# Patient Record
Sex: Male | Born: 1995 | Race: White | Hispanic: No | Marital: Single | Smoking: Never smoker
Health system: Southern US, Community
[De-identification: ages and names within clinical notes are randomized; demographics above are authoritative.]

## PROBLEM LIST (undated history)

## (undated) DIAGNOSIS — F419 Anxiety disorder, unspecified: Secondary | ICD-10-CM

## (undated) DIAGNOSIS — J45909 Unspecified asthma, uncomplicated: Secondary | ICD-10-CM

## (undated) DIAGNOSIS — B019 Varicella without complication: Secondary | ICD-10-CM

## (undated) HISTORY — DX: Varicella without complication: B01.9

## (undated) HISTORY — DX: Unspecified asthma, uncomplicated: J45.909

## (undated) HISTORY — PX: WISDOM TOOTH EXTRACTION: SHX21

## (undated) HISTORY — PX: OTHER SURGICAL HISTORY: SHX169

---
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
## 2015-02-22 IMAGING — CT CT HEAD WITHOUT CONTRAST
2 series · 14 of 30 positions shown, 16 images · non-contrast
Comparison: None.

CLINICAL DATA: Head injury with dizziness, headache and nausea.

EXAM:
CT HEAD WITHOUT CONTRAST
TECHNIQUE: Contiguous axial images were obtained from the base of the skull
through the vertex without intravenous contrast.

[Series 2: head wo · axial · 0.39mm/px · z∈[-116,-17]mm · 6 of 32 slices shown, 8 images]
[im 5/32  brain]
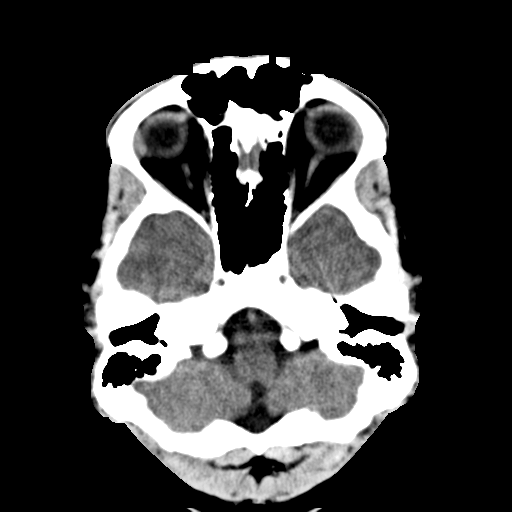
[im 5/32  bone]
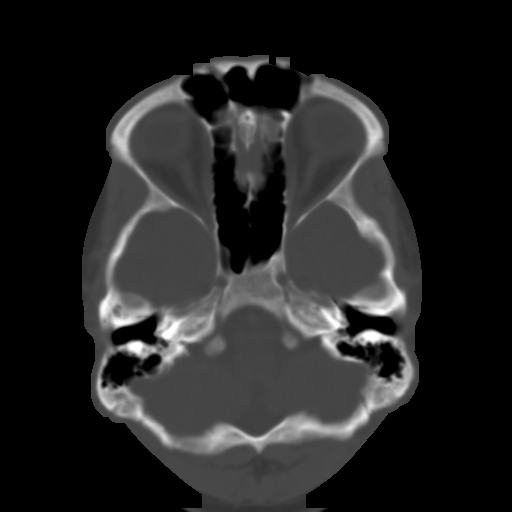
[im 9/32  brain]
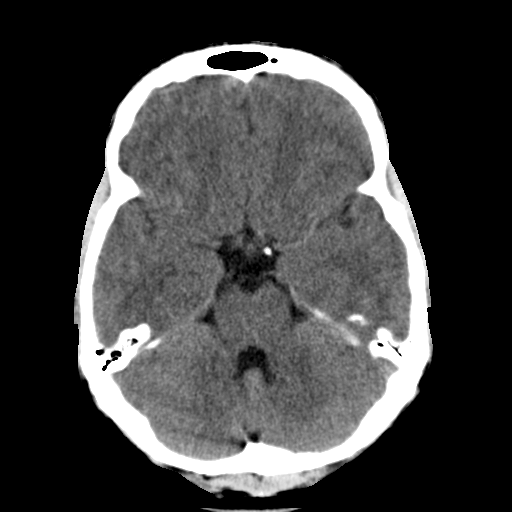
[im 14/32  brain]
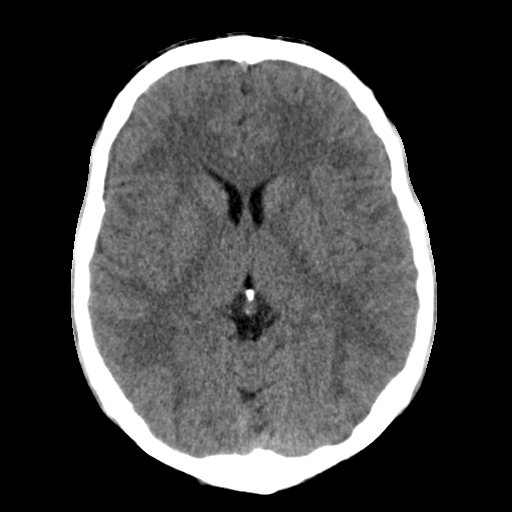
[im 18/32  brain]
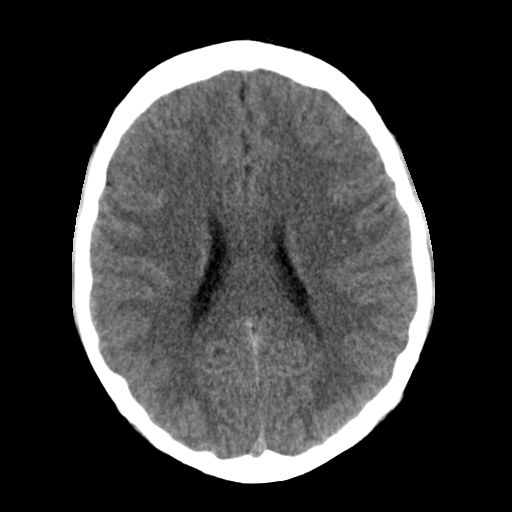
[im 23/32  brain]
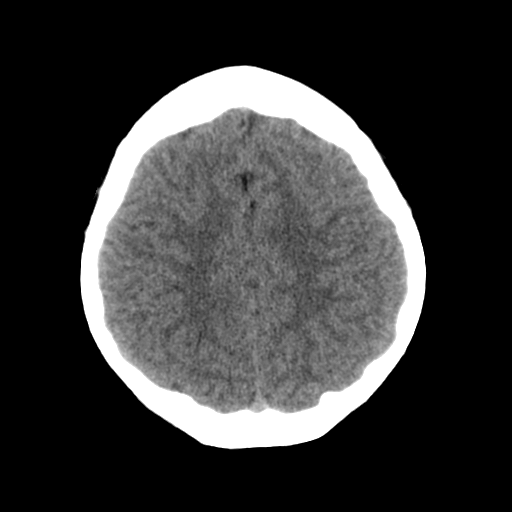
[im 23/32  bone]
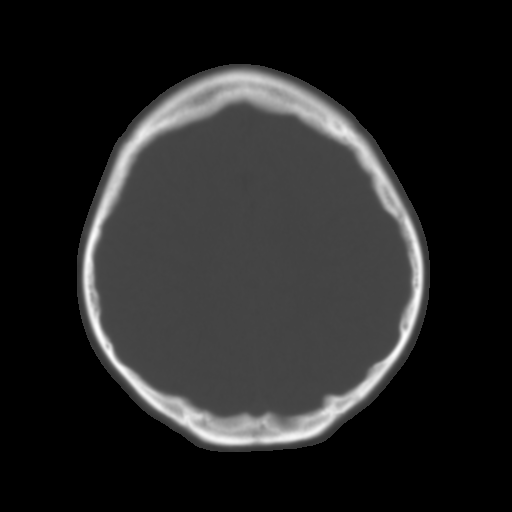
[im 27/32  brain]
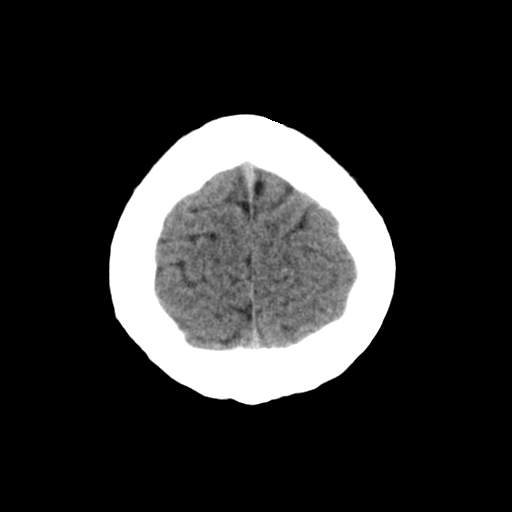

[Series 3: head bone · axial · 0.39mm/px · z∈[-122,-8]mm · 8 of 96 slices shown]
[im 10/96  bone]
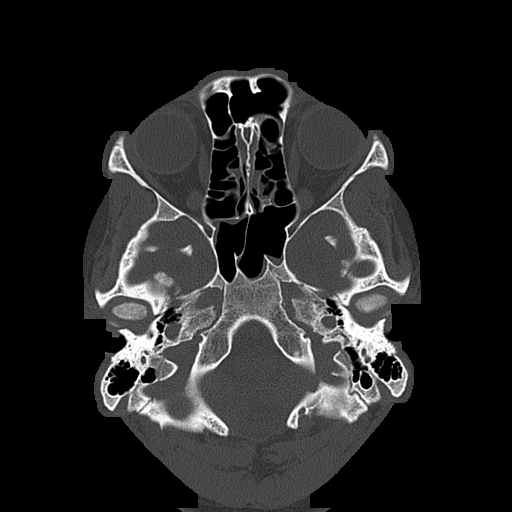
[im 19/96  bone]
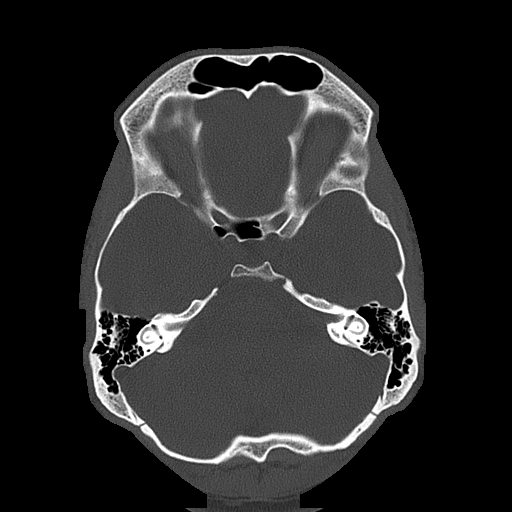
[im 32/96  bone]
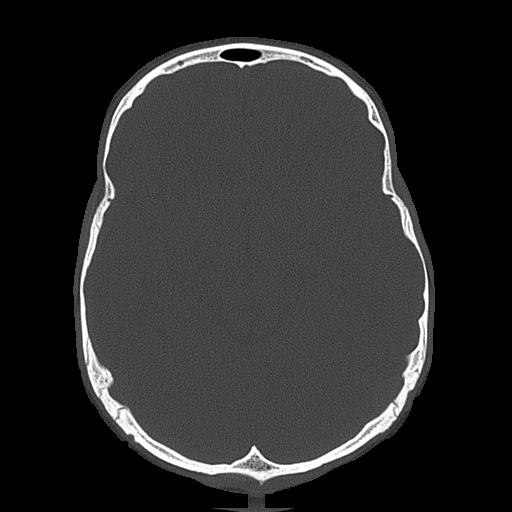
[im 41/96  bone]
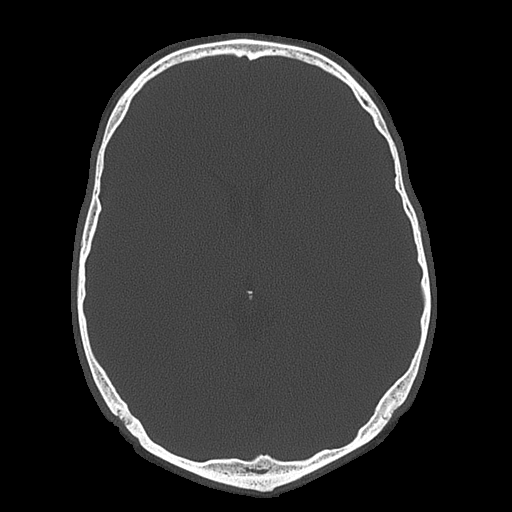
[im 55/96  bone]
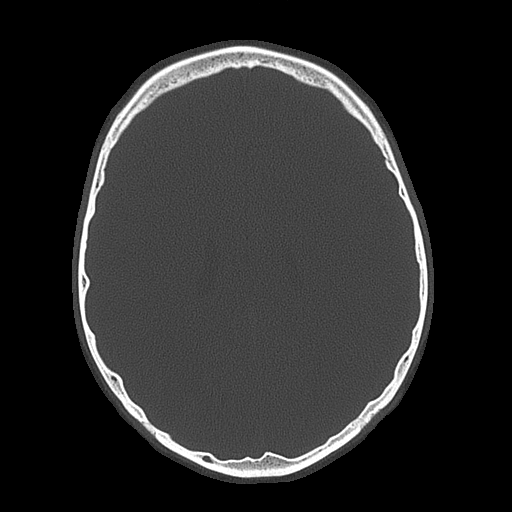
[im 64/96  bone]
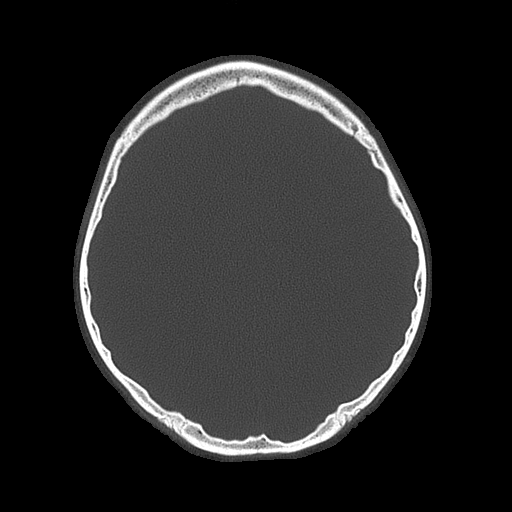
[im 77/96  bone]
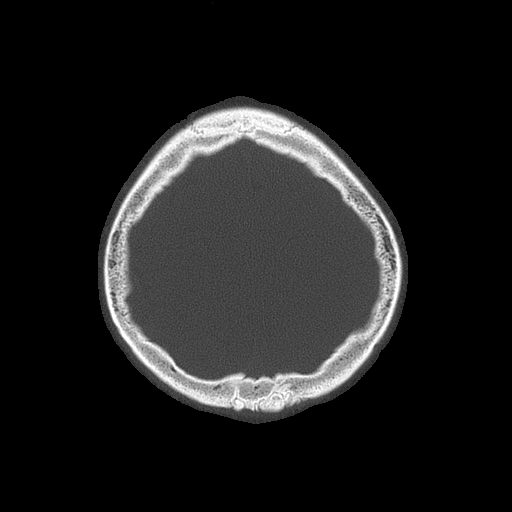
[im 86/96  bone]
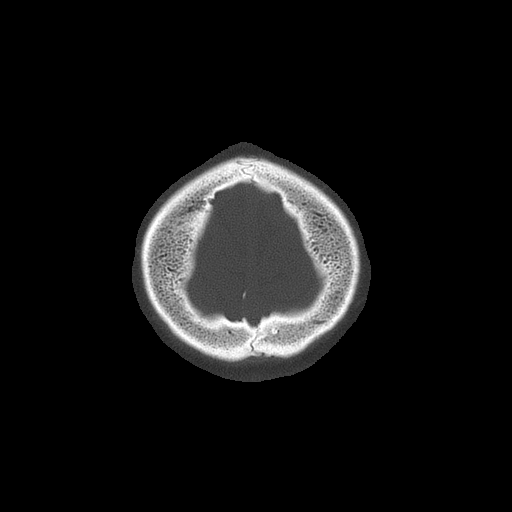

[14 of 30 positions shown; findings below may reference images not displayed]

FINDINGS: The ventricles and sulci are within normal limits for age. There is
no evidence of acute infarct, intracranial hemorrhage, mass, midline
shift, or extra-axial collection. The orbits are unremarkable. The
mastoid air cells are clear. Mild mucosal thickening is present in
the ethmoid, sphenoid, and maxillary sinuses bilaterally,
incompletely visualized. There is no evidence of acute fracture.
IMPRESSION: No evidence of acute intracranial abnormality. Unremarkable
appearance of the brain.

## 2015-06-04 ENCOUNTER — Ambulatory Visit: Payer: Self-pay | Admitting: Family Medicine

## 2015-08-06 ENCOUNTER — Encounter: Payer: Self-pay | Admitting: Family Medicine

## 2015-08-06 ENCOUNTER — Ambulatory Visit (INDEPENDENT_AMBULATORY_CARE_PROVIDER_SITE_OTHER): Payer: Commercial Indemnity | Admitting: Family Medicine

## 2015-08-06 VITALS — BP 110/82 | HR 73 | Temp 98.5°F | Ht 68.5 in | Wt 159.4 lb

## 2015-08-06 DIAGNOSIS — Z8349 Family history of other endocrine, nutritional and metabolic diseases: Secondary | ICD-10-CM

## 2015-08-06 DIAGNOSIS — Z23 Encounter for immunization: Secondary | ICD-10-CM

## 2015-08-06 DIAGNOSIS — Z0000 Encounter for general adult medical examination without abnormal findings: Secondary | ICD-10-CM | POA: Insufficient documentation

## 2015-08-06 DIAGNOSIS — Z130 Encounter for screening for diseases of the blood and blood-forming organs and certain disorders involving the immune mechanism: Secondary | ICD-10-CM

## 2015-08-06 DIAGNOSIS — Z83438 Family history of other disorder of lipoprotein metabolism and other lipidemia: Secondary | ICD-10-CM

## 2015-08-06 DIAGNOSIS — J45909 Unspecified asthma, uncomplicated: Secondary | ICD-10-CM | POA: Insufficient documentation

## 2015-08-06 DIAGNOSIS — Z833 Family history of diabetes mellitus: Secondary | ICD-10-CM

## 2015-08-06 DIAGNOSIS — J4520 Mild intermittent asthma, uncomplicated: Secondary | ICD-10-CM

## 2015-08-06 LAB — CBC
HEMATOCRIT: 47.9 % (ref 36.0–49.0)
Hemoglobin: 16.2 g/dL — ABNORMAL HIGH (ref 12.0–16.0)
MCHC: 33.8 g/dL (ref 31.0–37.0)
MCV: 89 fl (ref 78.0–98.0)
Platelets: 235 10*3/uL (ref 150.0–575.0)
RBC: 5.38 Mil/uL (ref 3.80–5.70)
RDW: 12.3 % (ref 11.4–15.5)
WBC: 9.5 10*3/uL (ref 4.5–13.5)

## 2015-08-06 LAB — LIPID PANEL
CHOLESTEROL: 158 mg/dL (ref 0–200)
HDL: 40.3 mg/dL (ref >39.00)
LDL Cholesterol: 96 mg/dL (ref 0–99)
NonHDL: 117.85
TRIGLYCERIDES: 107 mg/dL (ref 0.0–149.0)
Total CHOL/HDL Ratio: 4
VLDL: 21.4 mg/dL (ref 0.0–40.0)

## 2015-08-06 LAB — COMPREHENSIVE METABOLIC PANEL
ALBUMIN: 4.4 g/dL (ref 3.5–5.2)
ALT: 56 U/L — ABNORMAL HIGH (ref 0–53)
AST: 24 U/L (ref 0–37)
Alkaline Phosphatase: 84 U/L (ref 52–171)
BUN: 12 mg/dL (ref 6–23)
CALCIUM: 9.9 mg/dL (ref 8.4–10.5)
CHLORIDE: 101 meq/L (ref 96–112)
CO2: 28 mEq/L (ref 19–32)
CREATININE: 1.05 mg/dL (ref 0.40–1.50)
GFR: 95.98 mL/min (ref >60.00)
Glucose, Bld: 102 mg/dL — ABNORMAL HIGH (ref 70–99)
Potassium: 4 mEq/L (ref 3.5–5.1)
SODIUM: 138 meq/L (ref 135–145)
Total Bilirubin: 0.4 mg/dL (ref 0.2–1.2)
Total Protein: 7.4 g/dL (ref 6.0–8.3)

## 2015-08-06 LAB — HEMOGLOBIN A1C: Hgb A1c MFr Bld: 5.3 % (ref 4.6–6.5)

## 2015-08-06 MED ORDER — ALBUTEROL SULFATE HFA 108 (90 BASE) MCG/ACT IN AERS: 2.0000 | INHALATION_SPRAY | Freq: Four times a day (QID) | RESPIRATORY_TRACT | Status: DC | PRN

## 2015-08-06 NOTE — Patient Instructions (Signed)
It was nice to see you today.  We will call you with lab results on Monday.  Please see me annually and sooner if needed.  Call with any concerns or questions.  Take care  Dr. Adriana Simasook

## 2015-08-06 NOTE — Progress Notes (Signed)
Pre visit review using our clinic review tool, if applicable. No additional management support is needed unless otherwise documented below in the visit note. 

## 2015-08-06 NOTE — Progress Notes (Signed)
Subjective:  Patient ID: Ryan Norton, male    DOB: 1995/12/12  Age: 19 y.o. MRN: [id number]  CC: Establish care  HPI Ryan Norton is a 19 y.o. male presents to the clinic today to establish care.    Preventative Healthcare  Immunizations: In need a flu shot. Tetanus up-to-date as it was given in 2008.  Labs: Mother requests screening labs today.  Alcohol use: No.  Smoking/tobacco use: No.3  STD/HIV testing: N/A. Patient is not sexually active.  Regular dental exams: Yes.   Wears seat belt: Yes.   Asthma  Patient has a prior history of asthma.  Per patient and mother seems to be seasonal/exercise induced.  The last time he required albuterol was June of this year.  He needs an albuterol inhaler at home.  No recent shortness of breath, cough, or need for albuterol.  PMH, Surgical Hx, Family Hx, Social History reviewed and updated as below. Past Medical History  Diagnosis Date  . Asthma   . Chicken pox     Past Surgical History  Procedure Laterality Date  . No surgical history      Family History  Problem Relation Age of Onset  . Hyperlipidemia Father   . Hypertension Father   . Diabetes Paternal Grandmother     Social History  Substance Use Topics  . Smoking status: Never Smoker   . Smokeless tobacco: Never Used  . Alcohol Use: No   Review of Systems  Psychiatric/Behavioral:       Stress.  All other systems negative.  Objective:   Today's Vitals: BP 110/82 mmHg  Pulse 73  Temp(Src) 98.5 F (36.9 C) (Oral)  Ht 5' 8.5" (1.74 m)  Wt 159 lb 6 oz (72.292 kg)  BMI 23.88 kg/m2  SpO2 97%  Physical Exam  Constitutional: He is oriented to person, place, and time. He appears well-developed and well-nourished. No distress.  HENT:  Head: Normocephalic and atraumatic.  Nose: Nose normal.  Mouth/Throat: Oropharynx is clear and moist. No oropharyngeal exudate.  Normal TM's bilaterally.   Eyes: Conjunctivae are normal. No scleral icterus.    Neck: Neck supple. No thyromegaly present.  Cardiovascular: Normal rate and regular rhythm.   No murmur heard. Pulmonary/Chest: Effort normal and breath sounds normal. He has no wheezes. He has no rales.  Abdominal: Soft. He exhibits no distension. There is no tenderness. There is no rebound and no guarding.  Musculoskeletal: Normal range of motion. He exhibits no edema.  Lymphadenopathy:    He has no cervical adenopathy.  Neurological: He is alert and oriented to person, place, and time.  Skin: Skin is warm and dry. No rash noted.  Psychiatric: He has a normal mood and affect.  Vitals reviewed.  Assessment & Plan:   Problem List Items Addressed This Visit    Preventative health care - Primary    Flu shot given today. Today up-to-date. Lab work today at the insistence of the patient's mother:  CBC, CMP, lipid, A1c.      Asthma    Well controlled.  Albuterol Rx given today.      Relevant Medications   albuterol (PROVENTIL HFA;VENTOLIN HFA) 108 (90 BASE) MCG/ACT inhaler    Other Visit Diagnoses    Screening for deficiency anemia        Relevant Orders    CBC    Family history of hyperlipidemia        Relevant Orders    Lipid panel    Family history of  diabetes mellitus        Relevant Orders    Comp Met (CMET)    HgB A1c    Encounter for immunization           Outpatient Encounter Prescriptions as of 08/06/2015  Medication Sig  . albuterol (PROVENTIL HFA;VENTOLIN HFA) 108 (90 BASE) MCG/ACT inhaler Inhale 2 puffs into the lungs every 6 (six) hours as needed for wheezing or shortness of breath.   No facility-administered encounter medications on file as of 08/06/2015.    Follow-up: Annually or sooner if needed.    Coral Spikes DO

## 2015-08-06 NOTE — Assessment & Plan Note (Signed)
Well controlled.  Albuterol Rx given today.

## 2015-08-06 NOTE — Assessment & Plan Note (Signed)
Flu shot given today. Today up-to-date. Lab work today at the insistence of the patient's mother:  CBC, CMP, lipid, A1c.

## 2015-08-30 ENCOUNTER — Other Ambulatory Visit: Payer: Self-pay | Admitting: Family Medicine

## 2015-08-30 ENCOUNTER — Telehealth: Payer: Self-pay | Admitting: *Deleted

## 2015-08-30 ENCOUNTER — Other Ambulatory Visit (INDEPENDENT_AMBULATORY_CARE_PROVIDER_SITE_OTHER): Payer: Commercial Indemnity

## 2015-08-30 DIAGNOSIS — R945 Abnormal results of liver function studies: Secondary | ICD-10-CM

## 2015-08-30 DIAGNOSIS — R7989 Other specified abnormal findings of blood chemistry: Secondary | ICD-10-CM

## 2015-08-30 DIAGNOSIS — D582 Other hemoglobinopathies: Secondary | ICD-10-CM

## 2015-08-30 LAB — CBC
HEMATOCRIT: 50.8 % — AB (ref 36.0–49.0)
HEMOGLOBIN: 16.9 g/dL — AB (ref 12.0–16.0)
MCHC: 33.2 g/dL (ref 31.0–37.0)
MCV: 89.8 fl (ref 78.0–98.0)
Platelets: 262 10*3/uL (ref 150.0–575.0)
RBC: 5.66 Mil/uL (ref 3.80–5.70)
RDW: 12.8 % (ref 11.4–15.5)
WBC: 8.4 10*3/uL (ref 4.5–13.5)

## 2015-08-30 LAB — COMPREHENSIVE METABOLIC PANEL
ALK PHOS: 84 U/L (ref 52–171)
ALT: 48 U/L (ref 0–53)
AST: 24 U/L (ref 0–37)
Albumin: 4.6 g/dL (ref 3.5–5.2)
BUN: 12 mg/dL (ref 6–23)
CHLORIDE: 101 meq/L (ref 96–112)
CO2: 30 meq/L (ref 19–32)
Calcium: 10 mg/dL (ref 8.4–10.5)
Creatinine, Ser: 0.96 mg/dL (ref 0.40–1.50)
GFR: 106.36 mL/min (ref >60.00)
GLUCOSE: 93 mg/dL (ref 70–99)
POTASSIUM: 4 meq/L (ref 3.5–5.1)
Sodium: 139 mEq/L (ref 135–145)
Total Bilirubin: 0.6 mg/dL (ref 0.2–1.2)
Total Protein: 7.2 g/dL (ref 6.0–8.3)

## 2015-08-30 NOTE — Telephone Encounter (Signed)
Labs and dx?  

## 2016-07-11 ENCOUNTER — Ambulatory Visit (INDEPENDENT_AMBULATORY_CARE_PROVIDER_SITE_OTHER): Payer: Commercial Indemnity | Admitting: Family Medicine

## 2016-07-11 ENCOUNTER — Encounter: Payer: Self-pay | Admitting: Family Medicine

## 2016-07-11 VITALS — BP 108/72 | HR 66 | Temp 97.7°F | Ht 68.5 in | Wt 164.1 lb

## 2016-07-11 DIAGNOSIS — Z0000 Encounter for general adult medical examination without abnormal findings: Secondary | ICD-10-CM | POA: Diagnosis not present

## 2016-07-11 DIAGNOSIS — Z0001 Encounter for general adult medical examination with abnormal findings: Secondary | ICD-10-CM | POA: Insufficient documentation

## 2016-07-11 DIAGNOSIS — Z23 Encounter for immunization: Secondary | ICD-10-CM

## 2016-07-11 LAB — COMPREHENSIVE METABOLIC PANEL
ALT: 58 U/L — ABNORMAL HIGH (ref 0–53)
AST: 26 U/L (ref 0–37)
Albumin: 4.2 g/dL (ref 3.5–5.2)
Alkaline Phosphatase: 74 U/L (ref 39–117)
BUN: 10 mg/dL (ref 6–23)
CO2: 31 meq/L (ref 19–32)
CREATININE: 1 mg/dL (ref 0.40–1.50)
Calcium: 9 mg/dL (ref 8.4–10.5)
Chloride: 105 mEq/L (ref 96–112)
GFR: 100.59 mL/min (ref >60.00)
Glucose, Bld: 97 mg/dL (ref 70–99)
Potassium: 3.8 mEq/L (ref 3.5–5.1)
SODIUM: 140 meq/L (ref 135–145)
Total Bilirubin: 0.5 mg/dL (ref 0.2–1.2)
Total Protein: 7 g/dL (ref 6.0–8.3)

## 2016-07-11 LAB — HEMOGLOBIN A1C: Hgb A1c MFr Bld: 5.3 % (ref 4.6–6.5)

## 2016-07-11 LAB — LIPID PANEL
CHOLESTEROL: 137 mg/dL (ref 0–200)
HDL: 33.8 mg/dL — ABNORMAL LOW (ref >39.00)
LDL CALC: 82 mg/dL (ref 0–99)
NONHDL: 103.01
Total CHOL/HDL Ratio: 4
Triglycerides: 104 mg/dL (ref 0.0–149.0)
VLDL: 20.8 mg/dL (ref 0.0–40.0)

## 2016-07-11 LAB — CBC
HCT: 47.4 % (ref 39.0–52.0)
Hemoglobin: 15.9 g/dL (ref 13.0–17.0)
MCHC: 33.6 g/dL (ref 30.0–36.0)
MCV: 89.4 fl (ref 78.0–100.0)
Platelets: 240 10*3/uL (ref 150.0–400.0)
RBC: 5.3 Mil/uL (ref 4.22–5.81)
RDW: 12.8 % (ref 11.5–14.6)
WBC: 8.5 10*3/uL (ref 4.5–10.5)

## 2016-07-11 NOTE — Patient Instructions (Signed)

## 2016-07-11 NOTE — Assessment & Plan Note (Signed)
Flu shot today. Screening labs today. Remainder of preventative healthcare up to date.

## 2016-07-11 NOTE — Progress Notes (Signed)
Subjective:  Patient ID: Ryan Norton, male    DOB: Sep 24, 1996  Age: 20 y.o. MRN: [id number]  CC: Annual physical exam  HPI Ryan Calixyler J Blasdell is a 20 y.o. male presents to the clinic today for an annual physical exam.  Preventative Healthcare  Immunizations: In need a flu shot today. Tdap up to date.   Labs: Requesting screening labs.  Alcohol use: No.  Smoking/tobacco use: No  STD/HIV testing: N/A. Patient is not sexually active.  Regular dental exams: Yes.   Wears seat belt: Yes.   PMH, Surgical Hx, Family Hx, Social History reviewed and updated as below.  Past Medical History:  Diagnosis Date  . Asthma   . Chicken pox    Past Surgical History:  Procedure Laterality Date  . no surgical history     Family History  Problem Relation Age of Onset  . Hyperlipidemia Father   . Hypertension Father   . Diabetes Paternal Grandmother    Social History  Substance Use Topics  . Smoking status: Never Smoker  . Smokeless tobacco: Never Used  . Alcohol use No   Review of Systems General: Denies unexplained weight loss, fever. Skin: Denies new or changing mole, sore/wound that won't heal. ENT: Trouble hearing, ringing in the ears, sores in the mouth, hoarseness, trouble swallowing. Eyes: Denies trouble seeing/visual disturbance. Heart/CV: Denies chest pain, shortness of breath, edema, palpitations. Lungs/Resp: Denies cough, shortness of breath, hemoptysis. Abd/GI: Denies nausea, vomiting, diarrhea, constipation, abdominal pain, hematochezia, melena. GU: Denies dysuria, incontinence, hematuria, urinary frequency, difficulty starting/keeping stream, penile discharge, sexual difficulty, lump in testicles. MSK: Denies joint pain/swelling, myalgias. Neuro: Denies headaches, weakness, numbness, dizziness, syncope. Psych: Denies sadness, anxiety, stress, memory difficulty. Endocrine: Denies polyuria and polydipsia.  Objective:   Today's Vitals: BP 108/72 (BP Location:  Left Arm, Patient Position: Sitting, Cuff Size: Normal)   Pulse 66   Temp 97.7 F (36.5 C)   Ht 5' 8.5" (1.74 m)   Wt 164 lb 2 oz (74.4 kg)   SpO2 98%   BMI 24.59 kg/m   Physical Exam  Constitutional: He is oriented to person, place, and time. He appears well-developed and well-nourished. No distress.  HENT:  Head: Normocephalic and atraumatic.  Nose: Nose normal.  Mouth/Throat: Oropharynx is clear and moist. No oropharyngeal exudate.  Normal TM's bilaterally.   Eyes: Conjunctivae are normal. No scleral icterus.  Neck: Neck supple. No thyromegaly present.  Cardiovascular: Normal rate and regular rhythm.   No murmur heard. Pulmonary/Chest: Effort normal and breath sounds normal. He has no wheezes. He has no rales.  Abdominal: Soft. He exhibits no distension. There is no tenderness. There is no rebound and no guarding.  Musculoskeletal: Normal range of motion. He exhibits no edema.  Lymphadenopathy:    He has no cervical adenopathy.  Neurological: He is alert and oriented to person, place, and time.  Skin: Skin is warm and dry. No rash noted.  Psychiatric: He has a normal mood and affect.  Vitals reviewed.  Assessment & Plan:   Problem List Items Addressed This Visit    Annual physical exam - Primary    Flu shot today. Screening labs today. Remainder of preventative healthcare up to date.      Relevant Orders   CBC   Comprehensive metabolic panel   Hemoglobin A1c   Lipid panel    Other Visit Diagnoses   None.     Outpatient Encounter Prescriptions as of 07/11/2016  Medication Sig  . albuterol (PROVENTIL HFA;VENTOLIN  HFA) 108 (90 BASE) MCG/ACT inhaler Inhale 2 puffs into the lungs every 6 (six) hours as needed for wheezing or shortness of breath.   No facility-administered encounter medications on file as of 07/11/2016.     Follow-up: Annually  Everlene Other DO [facility]

## 2016-07-12 ENCOUNTER — Telehealth: Payer: Self-pay | Admitting: Family Medicine

## 2016-07-12 NOTE — Telephone Encounter (Signed)
Pt mom called returning your call. Thank you!  Call pt @ [phone].

## 2016-07-14 ENCOUNTER — Other Ambulatory Visit: Payer: Self-pay | Admitting: Family Medicine

## 2016-07-14 DIAGNOSIS — R7989 Other specified abnormal findings of blood chemistry: Secondary | ICD-10-CM

## 2016-07-14 DIAGNOSIS — R945 Abnormal results of liver function studies: Principal | ICD-10-CM

## 2016-07-14 NOTE — Telephone Encounter (Signed)
Pt mom called back to schedule his f/u lab for elevated ALT. Please enter lab

## 2016-07-14 NOTE — Telephone Encounter (Signed)
Pt mom was calling to get pt lab results from 07/11/16. Thank you!  Call pt mom @ [phone].

## 2016-07-14 NOTE — Telephone Encounter (Signed)
Spoke with Patients mom  Reviewed results, thanks

## 2016-07-14 NOTE — Telephone Encounter (Signed)
Order placed

## 2016-07-28 ENCOUNTER — Other Ambulatory Visit (INDEPENDENT_AMBULATORY_CARE_PROVIDER_SITE_OTHER): Payer: Commercial Indemnity

## 2016-07-28 DIAGNOSIS — R7989 Other specified abnormal findings of blood chemistry: Secondary | ICD-10-CM | POA: Diagnosis not present

## 2016-07-28 DIAGNOSIS — R945 Abnormal results of liver function studies: Principal | ICD-10-CM

## 2016-07-28 LAB — HEPATIC FUNCTION PANEL
ALBUMIN: 4.3 g/dL (ref 3.5–5.2)
ALK PHOS: 81 U/L (ref 39–117)
ALT: 50 U/L (ref 0–53)
AST: 23 U/L (ref 0–37)
BILIRUBIN DIRECT: 0.1 mg/dL (ref 0.0–0.3)
TOTAL PROTEIN: 7.3 g/dL (ref 6.0–8.3)
Total Bilirubin: 0.7 mg/dL (ref 0.2–1.2)

## 2016-09-27 ENCOUNTER — Encounter: Payer: Self-pay | Admitting: Family Medicine

## 2016-09-27 ENCOUNTER — Ambulatory Visit (INDEPENDENT_AMBULATORY_CARE_PROVIDER_SITE_OTHER): Payer: Managed Care, Other (non HMO) | Admitting: Family Medicine

## 2016-09-27 DIAGNOSIS — B9789 Other viral agents as the cause of diseases classified elsewhere: Secondary | ICD-10-CM | POA: Diagnosis not present

## 2016-09-27 DIAGNOSIS — K120 Recurrent oral aphthae: Secondary | ICD-10-CM | POA: Diagnosis not present

## 2016-09-27 DIAGNOSIS — J069 Acute upper respiratory infection, unspecified: Secondary | ICD-10-CM | POA: Diagnosis not present

## 2016-09-27 MED ORDER — PREDNISONE 50 MG PO TABS: ORAL_TABLET | ORAL | 0 refills | Status: DC

## 2016-09-27 MED ORDER — HYDROCOD POLST-CPM POLST ER 10-8 MG/5ML PO SUER: 5.0000 mL | Freq: Two times a day (BID) | ORAL | 0 refills | Status: DC | PRN

## 2016-09-27 MED ORDER — SULFURIC ACID-SULF PHENOLICS 30-50 % MT SOLN: OROMUCOSAL | 2 refills | Status: DC

## 2016-09-27 NOTE — Patient Instructions (Signed)
Take the prednisone and use the cough medication as needed.  Use the Debacterol per package insert.  This is viral and will slowly improve.  Take care  Dr. Adriana Simasook

## 2016-09-27 NOTE — Assessment & Plan Note (Signed)
New problem. Treating with Debacterol.

## 2016-09-27 NOTE — Assessment & Plan Note (Signed)
No acute problem. No indication for antibody screen Treating with prednisone and Tussionex given severe cough (and history of asthma).

## 2016-09-27 NOTE — Progress Notes (Signed)
Subjective:  Patient ID: Ryan Norton, male    DOB: [birth year]  Age: 20 y.o. MRN: [id number]  CC: Cough, Mouth ulcers, HA  HPI:  20 year old male presents with the above complaints.  Patient states that he's been sick for the past 2 days. He's had cough which she states is severe. He is also had drainage and headache. No fever. He also notes canker sores on the inside of the mouth. No known sick contacts. No known exacerbating or relieving factors. No other associated symptoms. No other complaints this time.  Social Hx   Social History   Social History  . Marital status: Single    Spouse name: N/A  . Number of children: N/A  . Years of education: N/A   Social History Main Topics  . Smoking status: Never Smoker  . Smokeless tobacco: Never Used  . Alcohol use No  . Drug use: No  . Sexual activity: No   Other Topics Concern  . None   Social History Narrative  . None   Review of Systems  Constitutional: Negative for fever.  HENT: Positive for mouth sores and postnasal drip.   Respiratory: Positive for cough.   Neurological: Positive for headaches.   Objective:  BP 116/67 (BP Location: Left Arm, Patient Position: Sitting, Cuff Size: Normal)   Pulse 80   Temp 98.4 F (36.9 C) (Oral)   Resp 12   Wt 166 lb (75.3 kg)   SpO2 96%   BMI 24.87 kg/m   BP/Weight 09/27/2016 07/11/2016 08/06/2015  Systolic BP 116 108 110  Diastolic BP 67 72 82  Wt. (Lbs) 166 164.13 159.38  BMI 24.87 24.59 23.88   Physical Exam  Constitutional: He is oriented to person, place, and time. He appears well-developed. No distress.  HENT:  Mild oropharyngeal erythema. 3 canker sores noted. Normal TMs bilaterally.  Eyes: Conjunctivae are normal.  Neck: Neck supple.  Cardiovascular: Normal rate and regular rhythm.   Pulmonary/Chest: Effort normal and breath sounds normal.  Lymphadenopathy:    He has no cervical adenopathy.  Neurological: He is alert and oriented to person, place, and  time.  Psychiatric: He has a normal mood and affect.  Vitals reviewed.   Lab Results  Component Value Date   WBC 8.5 07/11/2016   HGB 15.9 07/11/2016   HCT 47.4 07/11/2016   PLT 240.0 07/11/2016   GLUCOSE 97 07/11/2016   CHOL 137 07/11/2016   TRIG 104.0 07/11/2016   HDL 33.80 (L) 07/11/2016   LDLCALC 82 07/11/2016   ALT 50 07/28/2016   AST 23 07/28/2016   NA 140 07/11/2016   K 3.8 07/11/2016   CL 105 07/11/2016   CREATININE 1.00 07/11/2016   BUN 10 07/11/2016   CO2 31 07/11/2016   HGBA1C 5.3 07/11/2016    Assessment & Plan:   Problem List Items Addressed This Visit    URI (upper respiratory infection)    No acute problem. No indication for antibody screen Treating with prednisone and Tussionex given severe cough (and history of asthma).      Canker sore    New problem. Treating with Debacterol.          Meds ordered this encounter  Medications  . predniSONE (DELTASONE) 50 MG tablet    Sig: 1 tablet daily x 5 days.    Dispense:  5 tablet    Refill:  0  . chlorpheniramine-HYDROcodone (TUSSIONEX PENNKINETIC ER) 10-8 MG/5ML SUER    Sig: Take 5 mLs by mouth every  12 (twelve) hours as needed.    Dispense:  115 mL    Refill:  0  . Sulfuric Acid-Sulf Phenolics 30-50 % SOLN    Sig: Apply to ulceration per package instructions.    Dispense:  3 each    Refill:  2    Generic for Debacterol    Follow-up: PRN  Ryan Ryan Blessin Kanno DO [facility] [place]

## 2017-01-25 ENCOUNTER — Encounter: Payer: Self-pay | Admitting: Family Medicine

## 2017-01-25 ENCOUNTER — Ambulatory Visit
Admission: EM | Admit: 2017-01-25 | Discharge: 2017-01-25 | Disposition: A | Discharge status: Home / Self Care | Payer: Managed Care, Other (non HMO) | Attending: Family Medicine | Admitting: Family Medicine

## 2017-01-25 DIAGNOSIS — R0981 Nasal congestion: Secondary | ICD-10-CM | POA: Diagnosis present

## 2017-01-25 DIAGNOSIS — J029 Acute pharyngitis, unspecified: Secondary | ICD-10-CM | POA: Diagnosis not present

## 2017-01-25 DIAGNOSIS — R05 Cough: Secondary | ICD-10-CM

## 2017-01-25 DIAGNOSIS — J309 Allergic rhinitis, unspecified: Secondary | ICD-10-CM | POA: Diagnosis present

## 2017-01-25 NOTE — ED Triage Notes (Signed)
Pt states that he has a cough and nasal congestion with drainage starting 2 days ago Tuesday 01/23/17. C/o of being dizzy yesterday for 20 seconds, that resolved and has not returned.  Larose Hires, SMA

## 2017-01-25 NOTE — ED Provider Notes (Signed)
CSN: [id number]     Arrival date & time 01/25/17  0830 History   None    Chief Complaint  Patient presents with  . Nasal Congestion  . Cough  . Dizziness   (Consider location/radiation/quality/duration/timing/severity/associated sxs/prior Treatment) Subjective:   Ryan Norton is a 21 y.o. male who presents for evaluation of possible sinus infection. Symptoms include congestion, cough described as productive, subjective fevers, headache, lightheadedness, nasal congestion and sore throat with no chills, night sweats or weight loss. Onset of symptoms was 2 days ago, unchanged since that time.  He is drinking plenty of fluids.  Past history is significant for no history of pneumonia or bronchitis. Patient is a non-smoker. The following portions of the patient's history were reviewed and updated as appropriate: allergies, current medications, past family history, past medical history, past social history, past surgical history and problem list.  Review of Systems Pertinent items noted in HPI and remainder of comprehensive ROS otherwise negative.   Objective:  BP (!) 142/85 (BP Location: Left Arm)   Pulse 78   Temp 98.5 F (36.9 C) (Oral)   Resp 18   Ht  (1.753 m)   Wt 165 lb (74.8 kg)   SpO2 100%   BMI 24.37 kg/m   General Appearance:    Alert, cooperative, no distress, appears stated age Head:    Normocephalic, without obvious abnormality, atraumatic Eyes:    PERRL, conjunctiva/corneas clear, EOM's intact, fundi    benign, both eyes      Ears:    Normal TM's and external ear canals, both ears Nose:   Nares normal, septum midline, mucosa normal, no drainage    or sinus tenderness Throat:   Lips, mucosa, and tongue normal; teeth and gums normal Neck:   Supple, symmetrical, trachea midline, no adenopathy;       thyroid:  No enlargement/tenderness/nodules; no carotid   bruit or JVD Back:     Symmetric, no curvature, ROM normal, no CVA tenderness Lungs:     Clear to  auscultation bilaterally, respirations unlabored Chest wall:    No tenderness or deformity Heart:    Regular rate and rhythm, S1 and S2 normal, no murmur, rub   or gallop Abdomen:     Soft, non-tender, bowel sounds active all four quadrants,    no masses, no organomegaly Genitalia:    Normal male without lesion, discharge or tenderness Rectal:    Normal tone, normal prostate, no masses or tenderness;   guaiac negative stool Extremities:   Extremities normal, atraumatic, no cyanosis or edema Pulses:   2+ and symmetric all extremities Skin:   Skin color, texture, turgor normal, no rashes or lesions Lymph nodes:   Cervical, supraclavicular, and axillary nodes normal Neurologic:   CNII-XII intact. Normal strength, sensation and reflexes      throughout   Assessment:  Acute sinus congestion    Plan:  1. Over the counter decongestant 2. Nasal saline rinses as needed for congestion. 3. Tylenol and/or Motrin as needed for fevers  4. Drink plenty of fluids  5. Follow-up with PCP as needed and/or if symptoms worsen or persist.     The history is provided by the patient. No language interpreter was used.  Cough  Associated symptoms: fever and sore throat     Past Medical History:  Diagnosis Date  . Asthma   . Chicken pox    Past Surgical History:  Procedure Laterality Date  . no surgical history     Family History  Problem Relation Age of Onset  . Hyperlipidemia Father   . Hypertension Father   . Diabetes Paternal Grandmother    Social History  Substance Use Topics  . Smoking status: Never Smoker  . Smokeless tobacco: Never Used  . Alcohol use No    Review of Systems  Constitutional: Positive for fever.  HENT: Positive for congestion, postnasal drip, sinus pressure and sore throat.   Respiratory: Positive for cough.     Allergies  Patient has no known allergies.  Home Medications   Prior to Admission medications   Medication Sig Start Date End Date Taking?  Authorizing Provider  albuterol (PROVENTIL HFA;VENTOLIN HFA) 108 (90 BASE) MCG/ACT inhaler Inhale 2 puffs into the lungs every 6 (six) hours as needed for wheezing or shortness of breath. 08/06/15  Yes Tommie Sams, DO  chlorpheniramine-HYDROcodone (TUSSIONEX PENNKINETIC ER) 10-8 MG/5ML SUER Take 5 mLs by mouth every 12 (twelve) hours as needed. 09/27/16   Tommie Sams, DO  predniSONE (DELTASONE) 50 MG tablet 1 tablet daily x 5 days. 09/27/16   Tommie Sams, DO  Sulfuric Acid-Sulf Phenolics 30-50 % SOLN Apply to ulceration per package instructions. 09/27/16   Tommie Sams, DO   Meds Ordered and Administered this Visit  Medications - No data to display  BP (!) 142/85 (BP Location: Left Arm)   Pulse 78   Temp 98.5 F (36.9 C) (Oral)   Resp 18   Ht  (1.753 m)   Wt 165 lb (74.8 kg)   SpO2 100%   BMI 24.37 kg/m  No data found.   Physical Exam  Urgent Care Course     Procedures (including critical care time)  Labs Review Labs Reviewed - No data to display  Imaging Review No results found.   Visual Acuity Review  Right Eye Distance:   Left Eye Distance:   Bilateral Distance:    Right Eye Near:   Left Eye Near:    Bilateral Near:         MDM   1. Sinus congestion        Lurline Idol, [place] 01/25/17 [phone]

## 2017-05-30 ENCOUNTER — Telehealth: Payer: Self-pay | Admitting: *Deleted

## 2017-05-30 NOTE — Telephone Encounter (Signed)
FYI, can't reach patient as he is at work, scheduled for tomorrow to see you.

## 2017-05-30 NOTE — Telephone Encounter (Signed)
FYI Pt's mother called, she stated that pt is currently having dizziness along with being light headed. Pt is currently at work and can not answer his phone . He has been scheduled with Dr. Adriana Simasook 08/09.  Pt contact [phone] and can be reached after 4pm. Patient could not be transferred to team health, due to being at work

## 2017-05-31 ENCOUNTER — Encounter: Payer: Self-pay | Admitting: Family Medicine

## 2017-05-31 ENCOUNTER — Ambulatory Visit (INDEPENDENT_AMBULATORY_CARE_PROVIDER_SITE_OTHER): Payer: Managed Care, Other (non HMO) | Admitting: Family Medicine

## 2017-05-31 DIAGNOSIS — R42 Dizziness and giddiness: Secondary | ICD-10-CM | POA: Diagnosis not present

## 2017-05-31 MED ORDER — MECLIZINE HCL 25 MG PO TABS: 25.0000 mg | ORAL_TABLET | Freq: Three times a day (TID) | ORAL | 0 refills | Status: DC

## 2017-05-31 NOTE — Progress Notes (Signed)
   Subjective:  Patient ID: Ryan Norton, male    DOB: [birth year]  Age: 21 y.o. MRN: [id number]  CC: Dizziness  HPI:  21 year old male presents with complaints of dizziness.  Dizziness  Patient reports that he's been dizzy since Sunday.  Happens fairly often.  He states that it's mild.  Described as spinning. He states he is also had some lightheadedness. No presyncope.  Worse with certain motions and abrupt movements. Improves with rest.  No associated nausea or vomiting.  No recent illness.  Additionally, he reports associated headache. Headache is mild. No other complaints or concerns at this time.  Social Hx   Social History   Social History  . Marital status: Single    Spouse name: N/A  . Number of children: N/A  . Years of education: N/A   Social History Main Topics  . Smoking status: Never Smoker  . Smokeless tobacco: Never Used  . Alcohol use No  . Drug use: No  . Sexual activity: No   Other Topics Concern  . None   Social History Narrative  . None    Review of Systems  Constitutional: Negative.   Gastrointestinal: Negative for nausea and vomiting.  Neurological: Positive for dizziness, light-headedness and headaches.   Objective:  BP 128/88   Pulse 64   Temp 99 F (37.2 C) (Oral)   Wt 173 lb 12.8 oz (78.8 kg)   SpO2 96%   BMI 25.67 kg/m   BP/Weight 05/31/2017 01/25/2017 09/27/2016  Systolic BP 128 142 116  Diastolic BP 88 85 67  Wt. (Lbs) 173.8 165 166  BMI 25.67 24.37 24.87    Physical Exam  Constitutional: He is oriented to person, place, and time. He appears well-developed. No distress.  Cardiovascular: Normal rate and regular rhythm.   No murmur heard. Pulmonary/Chest: Effort normal. He has no wheezes. He has no rales.  Neurological: He is alert and oriented to person, place, and time.  Cranial nerves intact. Normal muscle strength. No nystagmus with Dix-Hallpike. He did have vertiginous symptoms upon rising.   Psychiatric:  He has a normal mood and affect.  Vitals reviewed.   Lab Results  Component Value Date   WBC 8.5 07/11/2016   HGB 15.9 07/11/2016   HCT 47.4 07/11/2016   PLT 240.0 07/11/2016   GLUCOSE 97 07/11/2016   CHOL 137 07/11/2016   TRIG 104.0 07/11/2016   HDL 33.80 (L) 07/11/2016   LDLCALC 82 07/11/2016   ALT 50 07/28/2016   AST 23 07/28/2016   NA 140 07/11/2016   K 3.8 07/11/2016   CL 105 07/11/2016   CREATININE 1.00 07/11/2016   BUN 10 07/11/2016   CO2 31 07/11/2016   HGBA1C 5.3 07/11/2016    Assessment & Plan:   Problem List Items Addressed This Visit    Vertigo    New problem. Suspect BPPV. Treated with meclizine.         Meds ordered this encounter  Medications  . meclizine (ANTIVERT) 25 MG tablet    Sig: Take 1 tablet (25 mg total) by mouth 3 (three) times daily.    Dispense:  30 tablet    Refill:  0     Follow-up: PRN  Everlene OtherJayce Mckenze Slone DO [facility] [place]

## 2017-05-31 NOTE — Assessment & Plan Note (Signed)
New problem. Suspect BPPV. Treated with meclizine.

## 2017-05-31 NOTE — Patient Instructions (Signed)

## 2017-08-01 ENCOUNTER — Encounter: Payer: Self-pay | Admitting: *Deleted

## 2017-08-01 ENCOUNTER — Ambulatory Visit
Admission: EM | Admit: 2017-08-01 | Discharge: 2017-08-01 | Disposition: A | Discharge status: Home / Self Care | Payer: Managed Care, Other (non HMO) | Attending: Family Medicine | Admitting: Family Medicine

## 2017-08-01 DIAGNOSIS — J069 Acute upper respiratory infection, unspecified: Secondary | ICD-10-CM

## 2017-08-01 DIAGNOSIS — B349 Viral infection, unspecified: Secondary | ICD-10-CM | POA: Diagnosis not present

## 2017-08-01 DIAGNOSIS — S90852A Superficial foreign body, left foot, initial encounter: Secondary | ICD-10-CM | POA: Diagnosis not present

## 2017-08-01 DIAGNOSIS — R05 Cough: Secondary | ICD-10-CM

## 2017-08-01 DIAGNOSIS — R51 Headache: Secondary | ICD-10-CM

## 2017-08-01 DIAGNOSIS — M79672 Pain in left foot: Secondary | ICD-10-CM

## 2017-08-01 NOTE — Discharge Instructions (Signed)
Soak foot 1-2 times daily. Warm soapy water.  Take care  Dr. Adriana Simas

## 2017-08-01 NOTE — ED Provider Notes (Signed)
[facility]    CSN: [id number] Arrival date & time: 08/01/17  1627  History   Chief Complaint Chief Complaint  Patient presents with  . Foot Pain   HPI  21 year old male presents with multiple foreign bodies in his left foot.  Patient states that he was playing with his dog last night. He slipped and fell on the deck and fell directly into a bush. He had no shoes on. He had multiple forms enter his left foot. He states that his foot is quite painful. He had difficulty working today as he is on his feet all day. Some of the areas are red and inflamed. He's had no fever or chills.  Additionally, patient states that he's had cough and headache which started today. No fevers or chills. No medications or intervention stride. No known exacerbating or relieving factors. No other associated symptoms. No other complaints at this time.  Past Medical History:  Diagnosis Date  . Asthma   . Chicken pox     Patient Active Problem List   Diagnosis Date Noted  . Vertigo 05/31/2017  . Annual physical exam 07/11/2016  . Preventative health care 08/06/2015  . Asthma 08/06/2015    Past Surgical History:  Procedure Laterality Date  . no surgical history      Home Medications    Prior to Admission medications   Medication Sig Start Date End Date Taking? Authorizing Provider  albuterol (PROVENTIL HFA;VENTOLIN HFA) 108 (90 BASE) MCG/ACT inhaler Inhale 2 puffs into the lungs every 6 (six) hours as needed for wheezing or shortness of breath. 08/06/15  Yes Belen Pesch G, DO  fexofenadine (ALLEGRA) 30 MG tablet Take 30 mg by mouth daily.   Yes [provider]  meclizine (ANTIVERT) 25 MG tablet Take 1 tablet (25 mg total) by mouth 3 (three) times daily. 05/31/17   Tommie Sams, DO    Family History Family History  Problem Relation Age of Onset  . Hyperlipidemia Father   . Hypertension Father   . Diabetes Paternal Grandmother     Social History Social History    Substance Use Topics  . Smoking status: Never Smoker  . Smokeless tobacco: Never Used  . Alcohol use No   Allergies   Patient has no known allergies.  Review of Systems Review of Systems  Constitutional: Negative.   Respiratory: Positive for cough.   Musculoskeletal:       Foot pain/foreign bodies.  Neurological: Positive for headaches.   Physical Exam Triage Vital Signs ED Triage Vitals  Enc Vitals Group     BP 08/01/17 1639 (!) 141/77     Pulse Rate 08/01/17 1639 91     Resp 08/01/17 1639 12     Temp 08/01/17 1639 98.7 F (37.1 C)     Temp Source 08/01/17 1639 Oral     SpO2 08/01/17 1639 100 %     Weight 08/01/17 1636 165 lb (74.8 kg)     Height 08/01/17 1636  (1.753 m)     Head Circumference --      Peak Flow --      Pain Score --      Pain Loc --      Pain Edu? --      Excl. in GC? --     Updated Vital Signs BP (!) 141/77 (BP Location: Left Arm)   Pulse 91   Temp 98.7 F (37.1 C) (Oral)   Resp 12   Ht  (1.753  m)   Wt 165 lb (74.8 kg)   SpO2 100%   BMI 24.37 kg/m   Physical Exam  Constitutional: He is oriented to person, place, and time. He appears well-developed. No distress.  Cardiovascular: Normal rate and regular rhythm.   Pulmonary/Chest: Effort normal and breath sounds normal. He has no wheezes. He has no rales.  Neurological: He is alert and oriented to person, place, and time.  Skin:  Left foot - multiple thorns noted in the plantar aspect of the foot.  Psychiatric: He has a normal mood and affect.  Vitals reviewed.  UC Treatments / Results  Labs (all labs ordered are listed, but only abnormal results are displayed) Labs Reviewed - No data to display  EKG  EKG Interpretation None       Radiology No results found.  Procedures Procedures (including critical care time)  Foreign body removal: Left foot  Area cleansed with Betadine.  No anesthetic was used.  11 blade was used to carefully extricate small thorns.  Approximately 10 thorns removed. Patient tolerated procedure well. No complications.  Medications Ordered in UC Medications - No data to display  Initial Impression / Assessment and Plan / UC Course  I have reviewed the triage vital signs and the nursing notes.  Pertinent labs & imaging results that were available during my care of the patient were reviewed by me and considered in my medical decision making (see chart for details).    21 year old male presents with a respiratory infection and multiple foreign bodies in his left foot. Foreign bodies removed without difficulty. Respiratory infection is viral. Supportive care.  Final Clinical Impressions(s) / UC Diagnoses   Final diagnoses:  Foreign body in left foot, initial encounter   New Prescriptions New Prescriptions   No medications on file   Controlled Substance Prescriptions [place] Controlled Substance Registry consulted? Not Applicable   Tommie Sams, DO 08/01/17 1721

## 2017-08-01 NOTE — ED Triage Notes (Signed)
Felt in a thorn bush and some thorns went into his left foot.  Pt thinks he still has 7 thorns in his left foot

## 2017-08-09 ENCOUNTER — Other Ambulatory Visit: Payer: Self-pay | Admitting: Family Medicine

## 2017-08-09 ENCOUNTER — Encounter: Payer: Self-pay | Admitting: Family Medicine

## 2017-08-09 ENCOUNTER — Ambulatory Visit (INDEPENDENT_AMBULATORY_CARE_PROVIDER_SITE_OTHER): Payer: Managed Care, Other (non HMO) | Admitting: Family Medicine

## 2017-08-09 VITALS — BP 104/70 | HR 57 | Temp 97.9°F | Ht 67.0 in | Wt 174.0 lb

## 2017-08-09 DIAGNOSIS — R945 Abnormal results of liver function studies: Principal | ICD-10-CM

## 2017-08-09 DIAGNOSIS — R7309 Other abnormal glucose: Secondary | ICD-10-CM

## 2017-08-09 DIAGNOSIS — R7989 Other specified abnormal findings of blood chemistry: Secondary | ICD-10-CM

## 2017-08-09 DIAGNOSIS — Z0000 Encounter for general adult medical examination without abnormal findings: Secondary | ICD-10-CM | POA: Diagnosis not present

## 2017-08-09 LAB — COMPREHENSIVE METABOLIC PANEL
ALT: 73 U/L — ABNORMAL HIGH (ref 0–53)
AST: 30 U/L (ref 0–37)
Albumin: 4.5 g/dL (ref 3.5–5.2)
Alkaline Phosphatase: 84 U/L (ref 39–117)
BUN: 10 mg/dL (ref 6–23)
CALCIUM: 9.7 mg/dL (ref 8.4–10.5)
CHLORIDE: 102 meq/L (ref 96–112)
CO2: 30 meq/L (ref 19–32)
CREATININE: 0.96 mg/dL (ref 0.40–1.50)
GFR: 104.36 mL/min (ref >60.00)
Glucose, Bld: 104 mg/dL — ABNORMAL HIGH (ref 70–99)
Potassium: 4.1 mEq/L (ref 3.5–5.1)
Sodium: 139 mEq/L (ref 135–145)
Total Bilirubin: 0.6 mg/dL (ref 0.2–1.2)
Total Protein: 7.4 g/dL (ref 6.0–8.3)

## 2017-08-09 NOTE — Assessment & Plan Note (Signed)
Physical exam completed. Encouraged to start exercising. HIV test deferred. Flu shot deferred. Obtain CMP. No other lab work needed. Advised to monitor the sores in his mouth and if they become more frequent or bothersome letting us know. He appears to be getting over an upper respiratory infection as well. He will monitor that and his asthma and if he has recurrent issues he'll let us know. He will also monitor his left foot for further healing. If he develops signs of infection he'll be evaluated again.

## 2017-08-09 NOTE — Progress Notes (Signed)
Tommi Rumps, MD Phone: [phone]  Ryan Norton is a 21 y.o. male who presents today for physical exam.  Does not exercise though he plans to. He avoids [place] and [place] food the does get some vegetables. She does drink some soda. Declines HIV testing. Tetanus vaccination up-to-date. Flu shot deferred. No tobacco use, alcohol use or illicit drug use. No family history of cancer. Is not sexually active. He is recently getting over an upper respiratory infection with cough. Notes his asthma did flare up a little bit with this and had to use his inhaler. Notes he is improving. He does note occasional sores inside his mouth. Sounds as though there are aphthous ulcers. His father has had them as well. They've been going on for some time. He has not at this time. He also went to urgent care and had several thorns removed from his left foot. The areas are healing well.  Active Ambulatory Problems    Diagnosis Date Noted  . Preventative health care 08/06/2015  . Asthma 08/06/2015  . Routine general medical examination at a health care facility 07/11/2016  . Vertigo 05/31/2017   Resolved Ambulatory Problems    Diagnosis Date Noted  . URI (upper respiratory infection) 09/27/2016  . Canker sore 09/27/2016  . Sinus congestion 01/25/2017   Past Medical History:  Diagnosis Date  . Asthma   . Chicken pox     Family History  Problem Relation Age of Onset  . Hyperlipidemia Father   . Hypertension Father   . Diabetes Paternal Grandmother     Social History   Social History  . Marital status: Single    Spouse name: N/A  . Number of children: N/A  . Years of education: N/A   Occupational History  . Not on file.   Social History Main Topics  . Smoking status: Never Smoker  . Smokeless tobacco: Never Used  . Alcohol use No  . Drug use: No  . Sexual activity: No   Other Topics Concern  . Not on file   Social History Narrative  . No narrative on file     ROS  General:  Negative for nexplained weight loss, fever Skin: Negative for new or changing mole, sore that won't heal HEENT: Positive for mouth sores, Negative for trouble hearing, trouble seeing, ringing in ears, hoarseness, change in voice, dysphagia. CV:  Negative for chest pain, edema, palpitations Resp: Positive for cough, dyspnea, negative for hemoptysis GI: Negative for nausea, vomiting, diarrhea, constipation, abdominal pain, melena, hematochezia. GU: Negative for dysuria, incontinence, urinary hesitance, hematuria, vaginal or penile discharge, polyuria, sexual difficulty, lumps in testicle or breasts MSK: Negative for muscle cramps or aches, joint pain or swelling Neuro: Negative for headaches, weakness, numbness, dizziness, passing out/fainting Psych: Negative for depression, anxiety, memory problems  Objective  Physical Exam Vitals:   08/09/17 0848  BP: 104/70  Pulse: (!) 57  Temp: 97.9 F (36.6 C)  SpO2: 98%    BP Readings from Last 3 Encounters:  08/09/17 104/70  08/01/17 (!) 141/77  05/31/17 128/88   Wt Readings from Last 3 Encounters:  08/09/17 174 lb (78.9 kg)  08/01/17 165 lb (74.8 kg)  05/31/17 173 lb 12.8 oz (78.8 kg)    Physical Exam  Constitutional: No distress.  HENT:  Head: Normocephalic and atraumatic.  Mouth/Throat: Oropharynx is clear and moist. No oropharyngeal exudate.  Eyes: Pupils are equal, round, and reactive to light. Conjunctivae are normal.  Cardiovascular: Normal rate, regular rhythm and normal heart  sounds.   Pulmonary/Chest: Effort normal and breath sounds normal.  Abdominal: Soft. Bowel sounds are normal. He exhibits no distension. There is no tenderness. There is no rebound and no guarding.  Musculoskeletal: He exhibits no edema.  Neurological: He is alert. Gait normal.  Skin: Skin is warm and dry. He is not diaphoretic.  Several well-healing scabs on his left plantar surface foot  Psychiatric: Mood and affect normal.      Assessment/Plan:   Routine general medical examination at a health care facility Physical exam completed. Encouraged to start exercising. HIV test deferred. Flu shot deferred. Obtain CMP. No other lab work needed. Advised to monitor the sores in his mouth and if they become more frequent or bothersome letting us know. He appears to be getting over an upper respiratory infection as well. He will monitor that and his asthma and if he has recurrent issues he'll let us know. He will also monitor his left foot for further healing. If he develops signs of infection he'll be evaluated again.   Orders Placed This Encounter  Procedures  . Comp Met (CMET)    No orders of the defined types were placed in this encounter.    Tommi Rumps, MD [place]

## 2017-08-09 NOTE — Patient Instructions (Signed)
Nice to meet you. Please start working on diet and exercise. If your asthma starts to bother you more with increased exercise please let us know. We'll obtain kidney and liver function today and contact you with the results.

## 2017-08-29 ENCOUNTER — Other Ambulatory Visit (INDEPENDENT_AMBULATORY_CARE_PROVIDER_SITE_OTHER): Payer: Managed Care, Other (non HMO)

## 2017-08-29 DIAGNOSIS — R7989 Other specified abnormal findings of blood chemistry: Secondary | ICD-10-CM

## 2017-08-29 DIAGNOSIS — R945 Abnormal results of liver function studies: Secondary | ICD-10-CM

## 2017-08-29 DIAGNOSIS — R7309 Other abnormal glucose: Secondary | ICD-10-CM | POA: Diagnosis not present

## 2017-08-29 LAB — HEPATIC FUNCTION PANEL
ALT: 41 U/L (ref 0–53)
AST: 21 U/L (ref 0–37)
Albumin: 4.5 g/dL (ref 3.5–5.2)
Alkaline Phosphatase: 70 U/L (ref 39–117)
BILIRUBIN TOTAL: 0.8 mg/dL (ref 0.2–1.2)
Bilirubin, Direct: 0.1 mg/dL (ref 0.0–0.3)
TOTAL PROTEIN: 7.5 g/dL (ref 6.0–8.3)

## 2017-08-29 LAB — HEMOGLOBIN A1C: Hgb A1c MFr Bld: 5.3 % (ref 4.6–6.5)

## 2017-08-30 ENCOUNTER — Telehealth: Payer: Self-pay | Admitting: Family Medicine

## 2017-08-30 NOTE — Telephone Encounter (Signed)
Copied from CRM [phone]. Topic: Quick Communication - See Telephone Encounter >> Aug 30, 2017  5:10 PM Trula SladeWalter, Linda F wrote: CRM for notification. See Telephone encounter for:  08/30/17. Patient is waiting on his lab results.  They also mentioned that the lab results were not put in MyChart.

## 2017-08-31 NOTE — Telephone Encounter (Signed)
Sent mychart message

## 2017-08-31 NOTE — Telephone Encounter (Signed)
Please advise 

## 2017-10-18 ENCOUNTER — Telehealth: Payer: Self-pay | Admitting: Family Medicine

## 2017-10-18 DIAGNOSIS — F321 Major depressive disorder, single episode, moderate: Secondary | ICD-10-CM

## 2017-10-18 NOTE — Addendum Note (Signed)
Addended by: Birdie SonsSONNENBERG, ERIC G on: 10/18/2017 03:20 PM   Modules accepted: Orders

## 2017-10-18 NOTE — Telephone Encounter (Signed)
Mother is calling regarding her son- Joselyn Glassmanyler has reached out to her today- he texted her to let her know that since his relationship ended in October- he has been having panic attacks, lack of appetite and depression. He feels he needs to be seen for this. After discussion with mother- she is going to check insurance and see if he needs a referral for counciling/phychiatric.  She has called back and she states he would like a referral if Dr Birdie SonsSonnenberg would do that for him. [phone]

## 2017-10-18 NOTE — Telephone Encounter (Signed)
Patient would like referral, he states he has had the thought for a slight second but then it leaves his mind, informed patient if he has any thoughts of suicide or harming himself he needs to be evaluated immediately, patient  Verbalized understanding

## 2017-10-18 NOTE — Telephone Encounter (Signed)
I am happy to refer him though I could also see him in the office to discuss this further and start treatment.  Please see if he would like to see me in the office.  Please also ensure that he is not having any thoughts of harming himself.  If he has thoughts of harming himself he needs to go to be evaluated now.  If he is not I can see him in the office.  Thanks.

## 2017-10-18 NOTE — Telephone Encounter (Signed)
Spoke with patient. He notes he feels as though it is difficult for him to feel happy. He notes he does not feel full on depressed. He notes he has had the thought of harming himself for a slight second and then it will go away. He notes no plan or intent to harm himself. He would prefer to avoid medication. He would like a referral to a therapist. Referral placed. Advised if he develops a plan or intent to harm himself he needs to go to the ED. He voiced understanding.

## 2017-10-18 NOTE — Telephone Encounter (Signed)
Please advise 

## 2017-11-13 ENCOUNTER — Ambulatory Visit (INDEPENDENT_AMBULATORY_CARE_PROVIDER_SITE_OTHER): Payer: 59 | Admitting: Psychology

## 2017-11-13 DIAGNOSIS — F4322 Adjustment disorder with anxiety: Secondary | ICD-10-CM | POA: Diagnosis not present

## 2017-11-28 ENCOUNTER — Ambulatory Visit (INDEPENDENT_AMBULATORY_CARE_PROVIDER_SITE_OTHER): Payer: 59 | Admitting: Psychology

## 2017-11-28 DIAGNOSIS — F4323 Adjustment disorder with mixed anxiety and depressed mood: Secondary | ICD-10-CM | POA: Diagnosis not present

## 2017-12-11 ENCOUNTER — Ambulatory Visit (INDEPENDENT_AMBULATORY_CARE_PROVIDER_SITE_OTHER): Payer: 59 | Admitting: Psychology

## 2017-12-11 DIAGNOSIS — F4323 Adjustment disorder with mixed anxiety and depressed mood: Secondary | ICD-10-CM

## 2018-02-21 ENCOUNTER — Encounter: Payer: Self-pay | Admitting: Emergency Medicine

## 2018-02-21 ENCOUNTER — Other Ambulatory Visit: Payer: Self-pay

## 2018-02-21 ENCOUNTER — Ambulatory Visit
Admission: EM | Admit: 2018-02-21 | Discharge: 2018-02-21 | Disposition: A | Discharge status: Home / Self Care | Payer: Managed Care, Other (non HMO) | Attending: Family Medicine | Admitting: Family Medicine

## 2018-02-21 DIAGNOSIS — J069 Acute upper respiratory infection, unspecified: Secondary | ICD-10-CM

## 2018-02-21 DIAGNOSIS — J302 Other seasonal allergic rhinitis: Secondary | ICD-10-CM | POA: Diagnosis not present

## 2018-02-21 MED ORDER — BENZONATATE 200 MG PO CAPS: ORAL_CAPSULE | ORAL | 0 refills | Status: DC

## 2018-02-21 MED ORDER — HYDROCOD POLST-CPM POLST ER 10-8 MG/5ML PO SUER: 5.0000 mL | Freq: Two times a day (BID) | ORAL | 0 refills | Status: DC

## 2018-02-21 NOTE — ED Provider Notes (Signed)
[facility]    CSN: [id number] Arrival date & time: 02/21/18  1807     History   Chief Complaint Chief Complaint  Patient presents with  . Cough    HPI Ryan Norton is a 22 y.o. male.   HPI  22 year old m States that he usually has seasonal allergies time a year but this is different with a cough. He continues to take Allegra on a daily basis. His had no fever or chills. The cough has been productive of white and green sputum.He has his usual sinus congestion for which the takes the Allegra. He denies any fever or chills.         Past Medical History:  Diagnosis Date  . Asthma   . Chicken pox     Patient Active Problem List   Diagnosis Date Noted  . Vertigo 05/31/2017  . Routine general medical examination at a health care facility 07/11/2016  . Preventative health care 08/06/2015  . Asthma 08/06/2015    Past Surgical History:  Procedure Laterality Date  . no surgical history    . WISDOM TOOTH EXTRACTION         Home Medications    Prior to Admission medications   Medication Sig Start Date End Date Taking? Authorizing Provider  albuterol (PROVENTIL HFA;VENTOLIN HFA) 108 (90 BASE) MCG/ACT inhaler Inhale 2 puffs into the lungs every 6 (six) hours as needed for wheezing or shortness of breath. 08/06/15  Yes Cook, Jayce G, DO  fexofenadine (ALLEGRA) 30 MG tablet Take 30 mg by mouth daily.   Yes [provider]  benzonatate (TESSALON) 200 MG capsule Take one cap TID PRN cough 02/21/18   Lutricia Feil, PA-C  chlorpheniramine-HYDROcodone [facility] ER) 10-8 MG/5ML SUER Take 5 mLs by mouth 2 (two) times daily. 02/21/18   Lutricia Feil, PA-C    Family History Family History  Problem Relation Age of Onset  . Hyperlipidemia Father   . Hypertension Father   . Diabetes Paternal Grandmother     Social History Social History   Tobacco Use  . Smoking status: Never Smoker  . Smokeless tobacco: Never Used  Substance  Use Topics  . Alcohol use: No    Alcohol/week: 0.0 oz  . Drug use: No     Allergies   Patient has no known allergies.   Review of Systems Review of Systems  Constitutional: Positive for activity change. Negative for chills, fatigue and fever.  HENT: Positive for congestion.   Respiratory: Positive for cough and shortness of breath.   All other systems reviewed and are negative.    Physical Exam Triage Vital Signs ED Triage Vitals  Enc Vitals Group     BP 02/21/18 1821 134/86     Pulse Rate 02/21/18 1821 66     Resp 02/21/18 1821 14     Temp 02/21/18 1821 98.2 F (36.8 C)     Temp Source 02/21/18 1821 Oral     SpO2 02/21/18 1821 100 %     Weight 02/21/18 1819 165 lb (74.8 kg)     Height 02/21/18 1819  (1.778 m)     Head Circumference --      Peak Flow --      Pain Score 02/21/18 1819 0     Pain Loc --      Pain Edu? --      Excl. in GC? --    No data found.  Updated Vital Signs BP 134/86 (BP Location:  Left Arm)   Pulse 66   Temp 98.2 F (36.8 C) (Oral)   Resp 14   Ht  (1.778 m)   Wt 165 lb (74.8 kg)   SpO2 100%   BMI 23.68 kg/m   Visual Acuity Right Eye Distance:   Left Eye Distance:   Bilateral Distance:    Right Eye Near:   Left Eye Near:    Bilateral Near:     Physical Exam  Constitutional: He is oriented to person, place, and time. He appears well-developed and well-nourished. No distress.  HENT:  Head: Normocephalic.  Right Ear: External ear normal.  Left Ear: External ear normal.  Nose: Nose normal.  Mouth/Throat: Oropharynx is clear and moist. No oropharyngeal exudate.  Eyes: Pupils are equal, round, and reactive to light. Right eye exhibits no discharge. Left eye exhibits no discharge.  Neck: Normal range of motion.  Pulmonary/Chest: Effort normal and breath sounds normal.  Musculoskeletal: Normal range of motion.  Lymphadenopathy:    He has no cervical adenopathy.  Neurological: He is alert and oriented to person, place,  and time.  Skin: Skin is warm and dry. He is not diaphoretic.  Psychiatric: He has a normal mood and affect. His behavior is normal. Judgment and thought content normal.  Nursing note and vitals reviewed.    UC Treatments / Results  Labs (all labs ordered are listed, but only abnormal results are displayed) Labs Reviewed - No data to display  EKG None  Radiology No results found.  Procedures Procedures (including critical care time)  Medications Ordered in UC Medications - No data to display  Initial Impression / Assessment and Plan / UC Course  I have reviewed the triage vital signs and the nursing notes.  Pertinent labs & imaging results that were available during my care of the patient were reviewed by me and considered in my medical decision making (see chart for details).    Plan: 1. Test/x-ray results and diagnosis reviewed with patient 2. rx as per orders; risks, benefits, potential side effects reviewed with patient 3. Recommend supportive treatment with use of Flonase daily for the next 3-4 weeks for drainage. Will treat his cough with a cough suppressants. He was told this is likely a virus and does not require antibiotics. He also has an overlay of allergies. 4. F/u prn if symptoms worsen or don't improve  Final Clinical Impressions(s) / UC Diagnoses   Final diagnoses:  Upper respiratory tract infection, unspecified type  Seasonal allergies     Discharge Instructions     Use Flonase daily for the next 3-4 weeks. Continue with your Allegra   ED Prescriptions    Medication Sig Dispense Auth. Provider   benzonatate (TESSALON) 200 MG capsule Take one cap TID PRN cough 30 capsule Lutricia Feil, PA-C   chlorpheniramine-HYDROcodone (TUSSIONEX PENNKINETIC ER) 10-8 MG/5ML SUER Take 5 mLs by mouth 2 (two) times daily. 115 mL Lutricia Feil, PA-C     Controlled Substance Prescriptions [place] Controlled Substance Registry consulted? Not Applicable   Lutricia Feil, PA-C 02/21/18 1918

## 2018-02-21 NOTE — Discharge Instructions (Signed)
Use Flonase daily for the next 3-4 weeks. Continue with your Allegra

## 2018-02-21 NOTE — ED Triage Notes (Signed)
Patient c/o cough and chest congestion since Saturday.   

## 2018-08-29 ENCOUNTER — Other Ambulatory Visit: Payer: Self-pay | Admitting: Family Medicine

## 2018-08-29 NOTE — Telephone Encounter (Signed)
Copied from CRM [id number]. Topic: Quick Communication - See Telephone Encounter >> Aug 29, 2018  3:56 PM Trula Slade wrote: CRM for notification. See Telephone encounter for: 08/29/18. Patient needs a refill on his albuterol (PROVENTIL HFA;VENTOLIN HFA) 108 (90 BASE) MCG/ACT inhaler medication refilled and sent to his preferred pharmacy [facility] in [place].

## 2018-08-29 NOTE — Telephone Encounter (Signed)
Requested medication (s) are due for refill today yes  Requested medication (s) are on the active medication list yes   Future visit scheduled  Yes-11/05/18 Medication listed under historical provider. Pended and routed for consideration   Requested Prescriptions  Pending Prescriptions Disp Refills   albuterol (PROVENTIL HFA;VENTOLIN HFA) 108 (90 Base) MCG/ACT inhaler 1 Inhaler 0    Sig: Inhale 2 puffs into the lungs every 6 (six) hours as needed for wheezing or shortness of breath.     Pulmonology:  Beta Agonists Failed - 08/29/2018  4:13 PM      Failed - One inhaler should last at least one month. If the patient is requesting refills earlier, contact the patient to check for uncontrolled symptoms.      Failed - Valid encounter within last 12 months    Recent Outpatient Visits          1 year ago Routine general medical examination at a health care facility   [facility] Glori Luis, MD   1 year ago Vertigo   [facility] [place], [place], [place]   1 year ago Viral upper respiratory tract infection   [facility] [address] [place], [place], [place]   2 years ago Annual physical exam   [facility] [place], [place], DO   3 years ago Preventative health care   [facility] [place], [place], [place]      Future Appointments            In 2 months Birdie Sons, Yehuda Mao, MD [facility], [facility]

## 2018-08-30 MED ORDER — ALBUTEROL SULFATE HFA 108 (90 BASE) MCG/ACT IN AERS: 2.0000 | INHALATION_SPRAY | Freq: Four times a day (QID) | RESPIRATORY_TRACT | 0 refills | Status: DC | PRN

## 2018-11-05 ENCOUNTER — Ambulatory Visit (INDEPENDENT_AMBULATORY_CARE_PROVIDER_SITE_OTHER): Payer: No Typology Code available for payment source | Admitting: Family Medicine

## 2018-11-05 ENCOUNTER — Encounter: Payer: Self-pay | Admitting: Family Medicine

## 2018-11-05 VITALS — BP 98/70 | HR 69 | Temp 98.5°F | Ht 67.5 in | Wt 177.6 lb

## 2018-11-05 DIAGNOSIS — Z0000 Encounter for general adult medical examination without abnormal findings: Secondary | ICD-10-CM | POA: Diagnosis not present

## 2018-11-05 DIAGNOSIS — Z13220 Encounter for screening for lipoid disorders: Secondary | ICD-10-CM | POA: Diagnosis not present

## 2018-11-05 DIAGNOSIS — E663 Overweight: Secondary | ICD-10-CM

## 2018-11-05 DIAGNOSIS — Z23 Encounter for immunization: Secondary | ICD-10-CM

## 2018-11-05 NOTE — Assessment & Plan Note (Signed)
Physical exam completed.  Discussed dietary changes.  He will return to exercising when orthopedics releases him.  Discussed long-term risks of excess weight.  Encouraged condom use if he becomes sexually active.  Encouraged him to see a dentist.  Lab work as outlined below. Td given. Discussed HPV vaccine and meningococcal B vaccine though patient declined. Patient is not at high risk of meningococcal B.

## 2018-11-05 NOTE — Patient Instructions (Signed)
Nice to see you. You are given a tetanus vaccination today. We will get lab work and contact you with the results. Please try to make some dietary changes as we discussed. Please return to exercising once orthopedics releases you.

## 2018-11-05 NOTE — Progress Notes (Signed)
Tommi Rumps, MD Phone: [phone]  Ryan Norton is a 23 y.o. male who presents today for cpe.  Exercise: Not exercising much recently given he partially tore his left MCL.  He is following with orthopedics for that.  He was playing soccer previously. Diet: Eats everything.  He gets vegetables most days.  He does have soda or sweet tea once a day.  He does eat junk food.  He does not typically eat breakfast.  He typically has to peanut butter sandwiches and beanie weenies for lunch.  He will have a variety of things for dinner. No family history of prostate or colon cancer. Flu vaccine up-to-date. He declines HIV screening. He is not sexually active. No tobacco use, alcohol use, or illicit drug use. He occasionally sees a Pharmacist, community.  He does not see an ophthalmologist.  Active Ambulatory Problems    Diagnosis Date Noted  . Preventative health care 08/06/2015  . Asthma 08/06/2015  . Routine general medical examination at a health care facility 07/11/2016  . Vertigo 05/31/2017   Resolved Ambulatory Problems    Diagnosis Date Noted  . URI (upper respiratory infection) 09/27/2016  . Canker sore 09/27/2016  . Sinus congestion 01/25/2017   Past Medical History:  Diagnosis Date  . Chicken pox     Family History  Problem Relation Age of Onset  . Hyperlipidemia Father   . Hypertension Father   . Diabetes Paternal Grandmother     Social History   Socioeconomic History  . Marital status: Single    Spouse name: Not on file  . Number of children: Not on file  . Years of education: Not on file  . Highest education level: Not on file  Occupational History  . Not on file  Social Needs  . Financial resource strain: Not on file  . Food insecurity:    Worry: Not on file    Inability: Not on file  . Transportation needs:    Medical: Not on file    Non-medical: Not on file  Tobacco Use  . Smoking status: Never Smoker  . Smokeless tobacco: Never Used  Substance and  Sexual Activity  . Alcohol use: No    Alcohol/week: 0.0 standard drinks  . Drug use: No  . Sexual activity: Never  Lifestyle  . Physical activity:    Days per week: Not on file    Minutes per session: Not on file  . Stress: Not on file  Relationships  . Social connections:    Talks on phone: Not on file    Gets together: Not on file    Attends religious service: Not on file    Active member of club or organization: Not on file    Attends meetings of clubs or organizations: Not on file    Relationship status: Not on file  . Intimate partner violence:    Fear of current or ex partner: Not on file    Emotionally abused: Not on file    Physically abused: Not on file    Forced sexual activity: Not on file  Other Topics Concern  . Not on file  Social History Narrative  . Not on file    ROS  General:  Negative for nexplained weight loss, fever Skin: Negative for new or changing mole, sore that won't heal HEENT: Negative for trouble hearing, trouble seeing, ringing in ears, mouth sores, hoarseness, change in voice, dysphagia. CV:  Negative for chest pain, dyspnea, edema, palpitations Resp: Negative for cough, dyspnea,  hemoptysis GI: Negative for nausea, vomiting, diarrhea, constipation, abdominal pain, melena, hematochezia. GU: Negative for dysuria, incontinence, urinary hesitance, hematuria, vaginal or penile discharge, polyuria, sexual difficulty, lumps in testicle or breasts MSK: Negative for muscle cramps or aches, joint pain or swelling Neuro: Negative for headaches, weakness, numbness, dizziness, passing out/fainting Psych: Negative for depression, anxiety, memory problems  Objective  Physical Exam Vitals:   11/05/18 1427  BP: 98/70  Pulse: 69  Temp: 98.5 F (36.9 C)  SpO2: 97%    BP Readings from Last 3 Encounters:  11/05/18 98/70  02/21/18 134/86  08/09/17 104/70   Wt Readings from Last 3 Encounters:  11/05/18 177 lb 9.6 oz (80.6 kg)  02/21/18 165 lb (74.8  kg)  08/09/17 174 lb (78.9 kg)    Physical Exam Constitutional:      General: He is not in acute distress.    Appearance: He is not diaphoretic.  HENT:     Head: Normocephalic and atraumatic.     Mouth/Throat:     Mouth: Mucous membranes are moist.     Pharynx: Oropharynx is clear.  Eyes:     Conjunctiva/sclera: Conjunctivae normal.     Pupils: Pupils are equal, round, and reactive to light.  Cardiovascular:     Rate and Rhythm: Normal rate and regular rhythm.     Heart sounds: Normal heart sounds.  Pulmonary:     Effort: Pulmonary effort is normal.     Breath sounds: Normal breath sounds.  Abdominal:     General: Bowel sounds are normal. There is no distension.     Palpations: Abdomen is soft.  Musculoskeletal:     Right lower leg: No edema.     Left lower leg: No edema.  Lymphadenopathy:     Cervical: No cervical adenopathy.  Skin:    General: Skin is warm and dry.  Neurological:     Mental Status: He is alert.  Psychiatric:        Mood and Affect: Mood normal.      Assessment/Plan:   Routine general medical examination at a health care facility Physical exam completed.  Discussed dietary changes.  He will return to exercising when orthopedics releases him.  Discussed long-term risks of excess weight.  Encouraged condom use if he becomes sexually active.  Encouraged him to see a dentist.  Lab work as outlined below. Td given. Discussed HPV vaccine and meningococcal B vaccine though patient declined. Patient is not at high risk of meningococcal B.    Orders Placed This Encounter  Procedures  . Tdap vaccine greater than or equal to 7yo IM  . Lipid panel  . Comp Met (CMET)  . HgB A1c    No orders of the defined types were placed in this encounter.    Tommi Rumps, MD [place]

## 2018-11-06 LAB — LIPID PANEL
CHOL/HDL RATIO: 4
Cholesterol: 166 mg/dL (ref 0–200)
HDL: 41.9 mg/dL (ref >39.00)
LDL CALC: 101 mg/dL — AB (ref 0–99)
NonHDL: 124.31
Triglycerides: 119 mg/dL (ref 0.0–149.0)
VLDL: 23.8 mg/dL (ref 0.0–40.0)

## 2018-11-06 LAB — HEMOGLOBIN A1C: Hgb A1c MFr Bld: 5.5 % (ref 4.6–6.5)

## 2018-11-06 LAB — COMPREHENSIVE METABOLIC PANEL
ALT: 113 U/L — AB (ref 0–53)
AST: 40 U/L — AB (ref 0–37)
Albumin: 4.6 g/dL (ref 3.5–5.2)
Alkaline Phosphatase: 91 U/L (ref 39–117)
BUN: 11 mg/dL (ref 6–23)
CHLORIDE: 101 meq/L (ref 96–112)
CO2: 28 meq/L (ref 19–32)
CREATININE: 1.01 mg/dL (ref 0.40–1.50)
Calcium: 9.8 mg/dL (ref 8.4–10.5)
GFR: 97.32 mL/min (ref >60.00)
Glucose, Bld: 84 mg/dL (ref 70–99)
Potassium: 3.9 mEq/L (ref 3.5–5.1)
SODIUM: 138 meq/L (ref 135–145)
Total Bilirubin: 0.3 mg/dL (ref 0.2–1.2)
Total Protein: 7.7 g/dL (ref 6.0–8.3)

## 2018-11-14 ENCOUNTER — Other Ambulatory Visit: Payer: Self-pay | Admitting: Family Medicine

## 2018-11-14 DIAGNOSIS — R7989 Other specified abnormal findings of blood chemistry: Secondary | ICD-10-CM

## 2018-11-14 DIAGNOSIS — R945 Abnormal results of liver function studies: Principal | ICD-10-CM

## 2018-12-19 ENCOUNTER — Encounter: Payer: Self-pay | Admitting: *Deleted

## 2018-12-19 ENCOUNTER — Ambulatory Visit: Payer: No Typology Code available for payment source | Admitting: Gastroenterology

## 2018-12-19 ENCOUNTER — Encounter: Payer: Self-pay | Admitting: Gastroenterology

## 2018-12-19 ENCOUNTER — Other Ambulatory Visit: Payer: Self-pay

## 2018-12-19 VITALS — BP 126/78 | HR 61 | Ht 67.5 in | Wt 181.4 lb

## 2018-12-19 DIAGNOSIS — R748 Abnormal levels of other serum enzymes: Secondary | ICD-10-CM | POA: Diagnosis not present

## 2018-12-19 NOTE — Patient Instructions (Signed)
You are scheduled for a RUQ abdominal US at [facility] outpatient imaging, [place] on Wednesday, March 18th at 8:00am. Please arrive at the registration desk at 7:45am. You cannot have anything to eat or drink after midnight on Tuesday night.   If you need to reschedule this appointment for any reason, please contact central scheduling at [phone].

## 2018-12-19 NOTE — Progress Notes (Signed)
Gastroenterology Consultation  Referring Provider:     Glori Luis, MD Primary Care Physician:  Glori Luis, MD Primary Gastroenterologist:  Dr. Servando Snare     Reason for Consultation:     Abnormal liver enzymes        HPI:   JONOTHAN MELIAN is a 23 y.o. y/o male referred for consultation & management of abnormal liver enzymes by Dr. Birdie Sons, Yehuda Mao, MD.  This patient comes in today with a history of abnormal liver enzymes.  The patient's liver enzymes have fluctuated between isolated elevated ALT to his most recent labs that show elevation of his AST and ALT.:  Component     Latest Ref Rng & Units 08/06/2015 08/30/2015 07/11/2016 07/28/2016  Sodium     135 - 145 mEq/L 138 139 140   Potassium     3.5 - 5.1 mEq/L 4.0 4.0 3.8   Chloride     96 - 112 mEq/L 101 101 105   CO2     19 - 32 mEq/L 28 30 31    Glucose     70 - 99 mg/dL 373 (H) 93 97   BUN     6 - 23 mg/dL 12 12 10    Creatinine     0.40 - 1.50 mg/dL 4.28 7.68 1.15   Total Bilirubin     0.2 - 1.2 mg/dL 0.4 0.6 0.5 0.7  Alkaline Phosphatase     39 - 117 U/L 84 84 74 81  AST     0 - 37 U/L 24 24 26 23   ALT     0 - 53 U/L 56 (H) 48 58 (H) 50  Total Protein     6.0 - 8.3 g/dL 7.4 7.2 7.0 7.3  Albumin     3.5 - 5.2 g/dL 4.4 4.6 4.2 4.3  Calcium     8.4 - 10.5 mg/dL 9.9 72.6 9.0   GFR     >60.00 mL/min 95.98 106.36 100.59    Component     Latest Ref Rng & Units 08/09/2017 08/29/2017 11/05/2018  Sodium     135 - 145 mEq/L 139  138  Potassium     3.5 - 5.1 mEq/L 4.1  3.9  Chloride     96 - 112 mEq/L 102  101  CO2     19 - 32 mEq/L 30  28  Glucose     70 - 99 mg/dL 203 (H)  84  BUN     6 - 23 mg/dL 10  11  Creatinine     0.40 - 1.50 mg/dL 5.59  7.41  Total Bilirubin     0.2 - 1.2 mg/dL 0.6 0.8 0.3  Alkaline Phosphatase     39 - 117 U/L 84 70 91  AST     0 - 37 U/L 30 21 40 (H)  ALT     0 - 53 U/L 73 (H) 41 113 (H)  Total Protein     6.0 - 8.3 g/dL 7.4 7.5 7.7  Albumin     3.5 - 5.2 g/dL  4.5 4.5 4.6  Calcium     8.4 - 10.5 mg/dL 9.7  9.8  GFR     >63.84 mL/min 104.36  97.32   The patient does not appear to have had any imaging of his liver. The patient reports that he was unaware of his abnormal liver enzymes but knows that he was called back after each blood passed over the last few  years to have them checked again.  As can be seen above every time the patient's liver enzymes are elevated a repeat liver enzymes come back as normal.  The patient denies diabetes hypercholesterolemia or alcohol abuse.  The patient does report that when the most recent labs were taken he was taking anti-inflammatory medication for an injury to his knee incurred while playing soccer.  The patient comes in with his mother today who corroborates his story.   Past Medical History:  Diagnosis Date  . Asthma   . Chicken pox     Past Surgical History:  Procedure Laterality Date  . no surgical history    . WISDOM TOOTH EXTRACTION      Prior to Admission medications   Medication Sig Start Date End Date Taking? Authorizing Provider  albuterol (PROVENTIL HFA;VENTOLIN HFA) 108 (90 Base) MCG/ACT inhaler Inhale 2 puffs into the lungs every 6 (six) hours as needed for wheezing or shortness of breath. 08/30/18   Glori Luis, MD  fexofenadine (ALLEGRA) 30 MG tablet Take 30 mg by mouth daily.    [provider]  meloxicam (MOBIC) 7.5 MG tablet meloxicam 7.5 mg tablet  TAKE 1 TABLET BY MOUTH TWICE A DAY    [provider]    Family History  Problem Relation Age of Onset  . Hyperlipidemia Father   . Hypertension Father   . Diabetes Paternal Grandmother      Social History   Tobacco Use  . Smoking status: Never Smoker  . Smokeless tobacco: Never Used  Substance Use Topics  . Alcohol use: No    Alcohol/week: 0.0 standard drinks  . Drug use: No    Allergies as of 12/19/2018  . (No Known Allergies)    Review of Systems:    All systems reviewed and negative except  where noted in HPI.   Physical Exam:  There were no vitals taken for this visit. No LMP for male patient. General:   Alert,  Well-developed, well-nourished, pleasant and cooperative in NAD Head:  Normocephalic and atraumatic. Eyes:  Sclera clear, no icterus.   Conjunctiva pink. Ears:  Normal auditory acuity. Nose:  No deformity, discharge, or lesions. Mouth:  No deformity or lesions,oropharynx pink & moist. Neck:  Supple; no masses or thyromegaly. Lungs:  Respirations even and unlabored.  Clear throughout to auscultation.   No wheezes, crackles, or rhonchi. No acute distress. Heart:  Regular rate and rhythm; no murmurs, clicks, rubs, or gallops. Abdomen:  Normal bowel sounds.  No bruits.  Soft, non-tender and non-distended without masses, hepatosplenomegaly or hernias noted.  No guarding or rebound tenderness.  Negative Carnett sign.   Rectal:  Deferred.  Msk:  Symmetrical without gross deformities.  Good, equal movement & strength bilaterally. Pulses:  Normal pulses noted. Extremities:  No clubbing or edema.  No cyanosis. Neurologic:  Alert and oriented x3;  grossly normal neurologically. Skin:  Intact without significant lesions or rashes.  No jaundice. Lymph Nodes:  No significant cervical adenopathy. Psych:  Alert and cooperative. Normal mood and affect.  Imaging Studies: No results found.  Assessment and Plan:   BURKE TERRY is a 23 y.o. y/o male who comes in with abnormal liver enzymes.  The patient's abnormal liver enzymes date back to 2016 and on repeat lab work his enzymes are was come back to normal.  The patient is likely being exposed to something that may be causing his abnormal liver enzymes which then returned normal.  Since he has not had  a right upper quadrant ultrasound the patient will have a right upper quadrant ultrasound ordered. The patient reports that he is no longer taking anti-inflammatory medication and he will have his liver enzymes checked as well as  viral serology.  The patient will be contacted with results of his tests.  The patient and his mother have been explained the plan and agree with it.  Midge Minium, MD. Clementeen Graham    Note: This dictation was prepared with Dragon dictation along with smaller phrase technology. Any transcriptional errors that result from this process are unintentional.

## 2018-12-20 LAB — HEPATIC FUNCTION PANEL
ALT: 65 IU/L — AB (ref 0–44)
AST: 27 IU/L (ref 0–40)
Albumin: 4.8 g/dL (ref 4.1–5.2)
Alkaline Phosphatase: 99 IU/L (ref 39–117)
Bilirubin Total: 0.3 mg/dL (ref 0.0–1.2)
Bilirubin, Direct: 0.1 mg/dL (ref 0.00–0.40)
Total Protein: 7.4 g/dL (ref 6.0–8.5)

## 2018-12-20 LAB — HEPATITIS A ANTIBODY, TOTAL: Hep A Total Ab: POSITIVE — AB

## 2018-12-20 LAB — HEPATITIS C ANTIBODY: Hep C Virus Ab: <0.1 s/co ratio (ref 0.0–0.9)

## 2018-12-20 LAB — HEPATITIS B SURFACE ANTIGEN: Hepatitis B Surface Ag: NEGATIVE

## 2018-12-20 LAB — HEPATITIS B SURFACE ANTIBODY,QUALITATIVE: HEP B SURFACE AB, QUAL: NONREACTIVE

## 2018-12-25 ENCOUNTER — Telehealth: Payer: Self-pay

## 2018-12-25 NOTE — Telephone Encounter (Signed)
-----   Message from Midge Minium, MD sent at 12/23/2018 11:38 AM EST ----- Let the patient know that his liver enzymes have come down drastically with everything being normal except his ALT which is 65.  It was 113.  The patient is immune to hepatitis A but should be vaccinated for hepatitis B.  Since his liver enzymes are coming down he should have a repeat liver enzyme test in 1 month.

## 2018-12-25 NOTE — Telephone Encounter (Signed)
Pt notified of lab results and to contact his PCP to get set up for the Hep B vaccine with either a nurse visit or at his next appointment.

## 2019-01-08 ENCOUNTER — Ambulatory Visit: Admission: RE | Admit: 2019-01-08 | Payer: No Typology Code available for payment source | Source: Ambulatory Visit

## 2019-01-24 ENCOUNTER — Telehealth: Payer: Self-pay

## 2019-01-24 NOTE — Telephone Encounter (Signed)
Tried contacting pt to inform him he is due for repeat liver enzymes but mailbox was full.

## 2019-03-05 ENCOUNTER — Telehealth: Payer: Self-pay

## 2019-03-05 NOTE — Telephone Encounter (Signed)
-----   Message from Rayann Heman, CMA sent at 12/25/2018  3:56 PM EST ----- Repeat LFT's. Pt aware and will be anxiously waiting for call.

## 2019-03-05 NOTE — Telephone Encounter (Signed)
Tried contacting pt on his cell but mailbox was full. Left vm on home phone for pt to return my call.

## 2019-03-18 ENCOUNTER — Other Ambulatory Visit: Payer: Self-pay

## 2019-03-18 DIAGNOSIS — R748 Abnormal levels of other serum enzymes: Secondary | ICD-10-CM

## 2019-03-19 NOTE — Telephone Encounter (Signed)
Unable to reach pt. Mailed lab order sheet to pt.

## 2019-06-02 ENCOUNTER — Encounter: Payer: Self-pay | Admitting: Family Medicine

## 2019-06-02 ENCOUNTER — Ambulatory Visit (INDEPENDENT_AMBULATORY_CARE_PROVIDER_SITE_OTHER): Payer: No Typology Code available for payment source | Admitting: Family Medicine

## 2019-06-02 ENCOUNTER — Other Ambulatory Visit: Payer: Self-pay

## 2019-06-02 DIAGNOSIS — F419 Anxiety disorder, unspecified: Secondary | ICD-10-CM

## 2019-06-02 DIAGNOSIS — F329 Major depressive disorder, single episode, unspecified: Secondary | ICD-10-CM

## 2019-06-02 DIAGNOSIS — G44209 Tension-type headache, unspecified, not intractable: Secondary | ICD-10-CM | POA: Diagnosis not present

## 2019-06-02 DIAGNOSIS — F32A Depression, unspecified: Secondary | ICD-10-CM | POA: Insufficient documentation

## 2019-06-02 DIAGNOSIS — R519 Headache, unspecified: Secondary | ICD-10-CM | POA: Insufficient documentation

## 2019-06-02 MED ORDER — ESCITALOPRAM OXALATE 10 MG PO TABS: 10.0000 mg | ORAL_TABLET | Freq: Every day | ORAL | 2 refills | Status: DC

## 2019-06-02 NOTE — Assessment & Plan Note (Signed)
Patient with symptoms of anxiety and depression.  Depression symptoms seem to be mild and only occasional.  I suspect the anxiety is contributing to his headaches as well.  We will start on Lexapro for anxiety and depression.  He will monitor his symptoms.  He will seek medical attention in the ED if he develops SI.  Follow-up in 1 month.

## 2019-06-02 NOTE — Progress Notes (Signed)
Virtual Visit via video Note  This visit type was conducted due to national recommendations for restrictions regarding the COVID-19 pandemic (e.g. social distancing).  This format is felt to be most appropriate for this patient at this time.  All issues noted in this document were discussed and addressed.  No physical exam was performed (except for noted visual exam findings with Video Visits).   I connected with Ryan Norton today at  3:30 PM EDT by a video enabled telemedicine application and verified that I am speaking with the correct person using two identifiers. Location patient: home Location provider: work Persons participating in the virtual visit: patient, provider  I discussed the limitations, risks, security and privacy concerns of performing an evaluation and management service by telephone and the availability of in person appointments. I also discussed with the patient that there may be a patient responsible charge related to this service. The patient expressed understanding and agreed to proceed.  Interactive audio and video telecommunications were attempted between this provider and patient, however failed, due to patient having technical difficulties OR patient did not have access to video capability.  We were able to complete 7 minutes of video visit and 8 minutes of phone visit.  We continued and completed visit with audio only.  Reason for visit: Same day visit.  HPI: Anxiety/depression: Patient notes anxiety has been increasing recently.  He notes every once in a while he will have a panic attack where he will feel shaky and have heavy breathing.  This can last a short period of time or can last a longer period of time.  He has not figured anything to help improve this.  These occur 2 to 4 days a week with his anxiety.  He occasionally feels depressed though the depression only lasts 1 to 2 hours.  That has been going on for the past 6 months.  He denies SI.  He feels as  though he is sleeping well.  Headache: Patient notes he does have a history of occasional headaches that feel similar to this though these headaches have been going on most days for the last 2 weeks.  They are frontal in nature.  He notes the headache is not there when he wakes up in the morning though builds as he is at work during the day.  He will go to bed and they will go away.  They do not wake him from sleep.  They are dull in nature.  No fevers, cough, shortness of breath, vision changes, numbness, weakness, congestion, or COVID-19 exposure.  He does report he has been more stressed over the last several weeks.   ROS: See pertinent positives and negatives per HPI.  Past Medical History:  Diagnosis Date  . Asthma   . Chicken pox     Past Surgical History:  Procedure Laterality Date  . no surgical history    . WISDOM TOOTH EXTRACTION      Family History  Problem Relation Age of Onset  . Hyperlipidemia Father   . Hypertension Father   . Diabetes Paternal Grandmother     SOCIAL HX: Smoker.   Current Outpatient Medications:  .  albuterol (PROVENTIL HFA;VENTOLIN HFA) 108 (90 Base) MCG/ACT inhaler, Inhale 2 puffs into the lungs every 6 (six) hours as needed for wheezing or shortness of breath., Disp: 1 Inhaler, Rfl: 0 .  fexofenadine (ALLEGRA) 30 MG tablet, Take 30 mg by mouth daily., Disp: , Rfl:  .  escitalopram (LEXAPRO) 10 MG  tablet, Take 1 tablet (10 mg total) by mouth daily., Disp: 3 tablet, Rfl: 2  EXAM:  VITALS per patient if applicable: None.  GENERAL: alert, oriented, appears well and in no acute distress  HEENT: atraumatic, conjunttiva clear, no obvious abnormalities on inspection of external nose and ears  NECK: normal movements of the head and neck  LUNGS: on inspection no signs of respiratory distress, breathing rate appears normal, no obvious gross SOB, gasping or wheezing  CV: no obvious cyanosis  MS: moves all visible extremities without noticeable  abnormality  PSYCH/NEURO: pleasant and cooperative, no obvious depression or anxiety, speech and thought processing grossly intact  ASSESSMENT AND PLAN:  Discussed the following assessment and plan:  Anxiety and depression Patient with symptoms of anxiety and depression.  Depression symptoms seem to be mild and only occasional.  I suspect the anxiety is contributing to his headaches as well.  We will start on Lexapro for anxiety and depression.  He will monitor his symptoms.  He will seek medical attention in the ED if he develops SI.  Follow-up in 1 month.  Headache Headaches sound to be consistent with tension headaches.  He denies other symptoms with these.  I suspect anxiety and stress are driving these.  We will treat his anxiety and depression with Lexapro as I feel this will help improve his headaches.  Follow-up in 1 month.    I discussed the assessment and treatment plan with the patient. The patient was provided an opportunity to ask questions and all were answered. The patient agreed with the plan and demonstrated an understanding of the instructions.   The patient was advised to call back or seek an in-person evaluation if the symptoms worsen or if the condition fails to improve as anticipated.  I provided 15 minutes of non-face-to-face time during this encounter.   Marikay AlarEric Tiziana Cislo, MD

## 2019-06-02 NOTE — Assessment & Plan Note (Signed)
Headaches sound to be consistent with tension headaches.  He denies other symptoms with these.  I suspect anxiety and stress are driving these.  We will treat his anxiety and depression with Lexapro as I feel this will help improve his headaches.  Follow-up in 1 month.

## 2019-06-03 ENCOUNTER — Telehealth: Payer: Self-pay

## 2019-06-03 MED ORDER — SERTRALINE HCL 50 MG PO TABS: 50.0000 mg | ORAL_TABLET | Freq: Every day | ORAL | 3 refills | Status: DC

## 2019-06-03 NOTE — Telephone Encounter (Signed)
Copied from Theresa [phone]. Topic: General - Other >> Jun 03, 2019  8:07 AM Keene Breath wrote: Reason for CRM: Patient called to let the nurse or doctor know that his medication is not working.  CB# [phone]

## 2019-06-03 NOTE — Addendum Note (Signed)
Addended by: Caryl Bis Davone Shinault G on: 06/03/2019 01:54 PM   Modules accepted: Orders

## 2019-06-03 NOTE — Telephone Encounter (Signed)
Called and spoke with the patient and he states he picked up his RX for Lexapro and he only took 1/2 tablet to start and after taking about 3- - 45 mins later he felt dizziness, nausea and he had diarrhea, they made him leave work. I he took 1/2 today thinking that maybe that was something else and the same thing happened and they sent him home, he will need a note for yesterday and today for work and you can send it to [facility] for him to print it off and also he may need a different medication, because of the side effects he is having.  Argie Lober,cma

## 2019-06-03 NOTE — Telephone Encounter (Signed)
I called and spoke to patient and informed him to stop the lexapro and to stay out of work the next 2 days to monitor how he is doing and to assure him that we will give him a doctor's note for yesterday and the days to follow today, I also informed him that an alternative to lexapro will be sent to the pharmacy and for him to take and let us know how he is doing.  Nina,cma

## 2019-06-03 NOTE — Telephone Encounter (Signed)
Noted. He should not take anymore of the lexapro as that is the likely cause of his symptoms. I would advise that he stay home for a couple of days and monitor for any additional symptoms after stopping the lexapro. If he has additional symptoms in the next 1-2 days he should let us know so we can evaluate further. I will send in an alternative. If he has similar symptoms with the new medication he needs to let us know.

## 2019-06-03 NOTE — Telephone Encounter (Signed)
Work note sent to patient through [employer].

## 2019-07-01 ENCOUNTER — Other Ambulatory Visit: Payer: Self-pay

## 2019-07-01 ENCOUNTER — Ambulatory Visit
Admission: EM | Admit: 2019-07-01 | Discharge: 2019-07-01 | Disposition: A | Discharge status: Home / Self Care | Payer: No Typology Code available for payment source | Attending: Urgent Care | Admitting: Urgent Care

## 2019-07-01 ENCOUNTER — Encounter: Payer: Self-pay | Admitting: Emergency Medicine

## 2019-07-01 DIAGNOSIS — Z7189 Other specified counseling: Secondary | ICD-10-CM

## 2019-07-01 DIAGNOSIS — J069 Acute upper respiratory infection, unspecified: Secondary | ICD-10-CM

## 2019-07-01 HISTORY — DX: Anxiety disorder, unspecified: F41.9

## 2019-07-01 LAB — RAPID STREP SCREEN (MED CTR MEBANE ONLY): Streptococcus, Group A Screen (Direct): NEGATIVE

## 2019-07-01 NOTE — ED Provider Notes (Signed)
[place], [place]   Name: Ryan Norton DOB: 1996/07/18 MRN: [id number] CSN: [id number] PCP: Leone Haven, MD  Arrival date and time:  07/01/19 1238  Chief Complaint:  Sore Throat, Cough, and Nasal Congestion   NOTE: Prior to seeing the patient today, I have reviewed the triage nursing documentation and vital signs. Clinical staff has updated patient's PMH/PSHx, current medication list, and drug allergies/intolerances to ensure comprehensive history available to assist in medical decision making.   History:   HPI: Ryan Norton is a 23 y.o. male who presents today with complaints of congestion, sore throat, and ear fullness that began with acute onset yesterday. He has not experienced any difficulties swallowing. He reports a very mild non-productive cough that began earlier this morning. Patient denies any fevers or shortness of breath. He is eating and drinking normally. Patient denies any changes in his ability to taste or smell. Patient has not been in close contact with anyone known to be ill. He reports recent travel to [facility]; just returned home yesterday. Patient advising that he has never been tested for SARS-CoV-2 (novel coronavirus) and would like to be tested today.   Past Medical History:  Diagnosis Date  . Anxiety   . Asthma   . Chicken pox     Past Surgical History:  Procedure Laterality Date  . no surgical history    . WISDOM TOOTH EXTRACTION      Family History  Problem Relation Age of Onset  . Hyperlipidemia Father   . Hypertension Father   . Diabetes Paternal Grandmother     Social History   Tobacco Use  . Smoking status: Never Smoker  . Smokeless tobacco: Never Used  Substance Use Topics  . Alcohol use: No    Alcohol/week: 0.0 standard drinks  . Drug use: No    Patient Active Problem List   Diagnosis Date Noted  . Anxiety and depression 06/02/2019  . Headache 06/02/2019  . Vertigo 05/31/2017  . Routine general medical examination at a  health care facility 07/11/2016  . Preventative health care 08/06/2015  . Asthma 08/06/2015    Home Medications:    Current Meds  Medication Sig  . albuterol (PROVENTIL HFA;VENTOLIN HFA) 108 (90 Base) MCG/ACT inhaler Inhale 2 puffs into the lungs every 6 (six) hours as needed for wheezing or shortness of breath.  . fexofenadine (ALLEGRA) 30 MG tablet Take 30 mg by mouth daily.  . sertraline (ZOLOFT) 50 MG tablet Take 1 tablet (50 mg total) by mouth daily.    Allergies:   Patient has no known allergies.  Review of Systems (ROS): Review of Systems  Constitutional: Negative for chills and fever.  HENT: Positive for congestion and sore throat. Negative for rhinorrhea, sinus pressure, sinus pain and trouble swallowing.   Respiratory: Positive for cough (minor). Negative for shortness of breath.   Cardiovascular: Negative for chest pain and palpitations.  Gastrointestinal: Negative for diarrhea, nausea and vomiting.  Musculoskeletal: Positive for myalgias (minor).  Neurological: Negative for dizziness, syncope, weakness and headaches.  Hematological: Negative for adenopathy.  All other systems reviewed and are negative.    Vital Signs: Today's Vitals   07/01/19 1249 07/01/19 1253  BP:  128/89  Pulse:  83  Resp:  18  Temp:  98.2 F (36.8 C)  TempSrc:  Oral  SpO2:  99%  Weight: 175 lb (79.4 kg)   Height: 5\' 7"  (1.702 m)   PainSc: 2      Physical Exam: Physical Exam  Constitutional: He is oriented to person, place, and time and well-developed, well-nourished, and in no distress. No distress.  HENT:  Head: Normocephalic and atraumatic.  Right Ear: Hearing, tympanic membrane, external ear and ear canal normal.  Left Ear: Hearing, tympanic membrane, external ear and ear canal normal.  Nose: Mucosal edema and rhinorrhea present. No sinus tenderness.  Mouth/Throat: Uvula is midline. Posterior oropharyngeal erythema present. No posterior oropharyngeal edema.  Eyes: Pupils are  equal, round, and reactive to light. Conjunctivae and EOM are normal.  Neck: Normal range of motion. Neck supple. No tracheal deviation present.  Cardiovascular: Normal rate, regular rhythm, normal heart sounds and intact distal pulses. Exam reveals no gallop and no friction rub.  No murmur heard. Pulmonary/Chest: Effort normal and breath sounds normal. No respiratory distress. He has no wheezes. He has no rales.  Abdominal: Soft. Bowel sounds are normal. He exhibits no distension. There is no abdominal tenderness.  Musculoskeletal: Normal range of motion.  Lymphadenopathy:    He has no cervical adenopathy.  Neurological: He is alert and oriented to person, place, and time. Gait normal. GCS score is 15.  Skin: Skin is warm and dry. No rash noted. He is not diaphoretic.  Psychiatric: Mood, memory, affect and judgment normal.  Nursing note and vitals reviewed.   Urgent Care Treatments / Results:   LABS: PLEASE NOTE: all labs that were ordered this encounter are listed, however only abnormal results are displayed. Labs Reviewed  RAPID STREP SCREEN ([facility] ONLY)  NOVEL CORONAVIRUS, NAA (HOSP ORDER, SEND-OUT TO REF LAB; TAT 18-24 HRS)  CULTURE, GROUP A STREP [facility])    EKG: -None  RADIOLOGY: No results found.  PROCEDURES: Procedures  MEDICATIONS RECEIVED THIS VISIT: Medications - No data to display  PERTINENT CLINICAL COURSE NOTES/UPDATES:   Initial Impression / Assessment and Plan / Urgent Care Course:  Pertinent labs & imaging results that were available during my care of the patient were personally reviewed by me and considered in my medical decision making (see lab/imaging section of note for values and interpretations).  MICHALL Norton is a 23 y.o. male who presents to [facility] today with complaints of Sore Throat, Cough, and Nasal Congestion   Patient overall well appearing and in no acute distress today in clinic. Presenting symptoms (see HPI) and  exam as documented above. He presents with symptoms associated with SARS-CoV-2 (novel coronavirus). Discussed typical symptom constellation. Reviewed potential for infection with recent travel. SARS-CoV-2 swab collected by certified clinical staff. Discussed variable turn around times associated with testing, as swabs are being processed at [facility], and have been between 2-5 days to come back. He was advised to self quarantine, per [facility] DHHS guidelines, until negative results received.   Rapid streptococcal swab negative. Reflex culture sent. Discussed that symptoms felt to be viral in nature at this point. Dicussed supportive care measures at home during acute phase of illness. Patient to rest as much as possible. He was encouraged to ensure adequate hydration (water and ORS) to prevent dehydration and electrolyte derangements. May use warm salt water gargles and throat lozenges as needed to soothe throat. Patient may use APAP and/or IBU on an as needed basis for pain/fever. Patient to monitor symptoms at home, continue OTC interventions, and return call to the clinic if worsening.   Current clinical condition warrants patient being out of work in order to recover from his current injury/illness. He was provided with the appropriate documentation to provide to his place of employment  that will allow for him to RTW on 07/03/2019 with no restrictions. RTW is contingent on his SARS-CoV-2 test results being reviewed as negative.    Discussed follow up with primary care physician in 1 week for re-evaluation. I have reviewed the follow up and strict return precautions for any new or worsening symptoms. Patient is aware of symptoms that would be deemed urgent/emergent, and would thus require further evaluation either here or in the emergency department. At the time of discharge, he verbalized understanding and consent with the discharge plan as it was reviewed with him. All questions were fielded by provider and/or  clinic staff prior to patient discharge.    Final Clinical Impressions / Urgent Care Diagnoses:   Final diagnoses:  Advice Given About Covid-19 Virus Infection  Viral URI    New Prescriptions:  [place] Controlled Substance Registry consulted? Not Applicable  No orders of the defined types were placed in this encounter.   Recommended Follow up Care:  Patient encouraged to follow up with the following provider within the specified time frame, or sooner as dictated by the severity of his symptoms. As always, he was instructed that for any urgent/emergent care needs, he should seek care either here or in the emergency department for more immediate evaluation.  Follow-up Information    Glori LuisSonnenberg, Eric G, MD In 1 week.   Specialty: Family Medicine Why: General reassessment of symptoms if not improving Contact information: [address] [place] [address] [phone]         NOTE: This note was prepared using Dragon dictation software along with smaller phrase technology. Despite my best ability to proofread, there is the potential that transcriptional errors may still occur from this process, and are completely unintentional.     Verlee MonteGray, Louretta Tantillo E, NP 07/01/19 1527

## 2019-07-01 NOTE — ED Triage Notes (Signed)
Patient states he returned from [facility] yesterday. He states he started having a sore throat, nasal congestion and ear fullness. He reports a cough that started this morning. Denies fever.

## 2019-07-01 NOTE — Discharge Instructions (Addendum)
It was very nice seeing you today in clinic. Thank you for entrusting me with your care.   Strep test was negative. Rest and increase hydration. May use over the counter Mucinex as needed. Warm salt water gargles may help soothe your throat.   You were tested for SARS-CoV-2 (novel coronavirus) today. Please self quarantine at home, per [facility] guidelines, until negative results have been received.    Make arrangements to follow up with your regular doctor in 1 week for re-evaluation if not improving. If your symptoms/condition worsens, please seek follow up care either here or in the ER. Please remember, our [place] providers are "right here with you" when you need [place].   Again, it was my pleasure to take care of you today. Thank you for choosing our clinic. I hope that you start to feel better quickly.   Honor Loh, MSN, APRN, FNP-C, CEN Advanced Practice Provider [facility]

## 2019-07-02 ENCOUNTER — Ambulatory Visit: Payer: No Typology Code available for payment source | Admitting: Internal Medicine

## 2019-07-02 LAB — NOVEL CORONAVIRUS, NAA (HOSP ORDER, SEND-OUT TO REF LAB; TAT 18-24 HRS): SARS-CoV-2, NAA: NOT DETECTED

## 2019-07-03 ENCOUNTER — Encounter (HOSPITAL_COMMUNITY): Payer: Self-pay

## 2019-07-04 ENCOUNTER — Ambulatory Visit: Payer: No Typology Code available for payment source | Admitting: Family Medicine

## 2019-07-04 LAB — CULTURE, GROUP A STREP (THRC)

## 2019-07-25 ENCOUNTER — Other Ambulatory Visit: Payer: Self-pay | Admitting: Family Medicine

## 2019-08-26 ENCOUNTER — Other Ambulatory Visit: Payer: Self-pay

## 2019-08-26 DIAGNOSIS — Z20822 Contact with and (suspected) exposure to covid-19: Secondary | ICD-10-CM

## 2019-08-27 LAB — NOVEL CORONAVIRUS, NAA: SARS-CoV-2, NAA: NOT DETECTED

## 2019-11-14 ENCOUNTER — Ambulatory Visit: Payer: No Typology Code available for payment source | Attending: Internal Medicine

## 2019-11-14 DIAGNOSIS — Z20822 Contact with and (suspected) exposure to covid-19: Secondary | ICD-10-CM

## 2019-11-15 LAB — NOVEL CORONAVIRUS, NAA: SARS-CoV-2, NAA: DETECTED — AB

## 2020-02-13 ENCOUNTER — Other Ambulatory Visit: Payer: Self-pay | Admitting: Gastroenterology

## 2020-02-13 DIAGNOSIS — R748 Abnormal levels of other serum enzymes: Secondary | ICD-10-CM

## 2020-03-13 ENCOUNTER — Other Ambulatory Visit: Payer: Self-pay | Admitting: Family Medicine

## 2020-06-22 ENCOUNTER — Encounter: Payer: Self-pay | Admitting: Family Medicine

## 2020-06-22 ENCOUNTER — Telehealth (INDEPENDENT_AMBULATORY_CARE_PROVIDER_SITE_OTHER): Payer: No Typology Code available for payment source | Admitting: Family Medicine

## 2020-06-22 ENCOUNTER — Other Ambulatory Visit: Payer: Self-pay

## 2020-06-22 DIAGNOSIS — R0981 Nasal congestion: Secondary | ICD-10-CM | POA: Diagnosis not present

## 2020-06-22 MED ORDER — CETIRIZINE HCL 10 MG PO TABS: 10.0000 mg | ORAL_TABLET | Freq: Every day | ORAL | 11 refills | Status: DC

## 2020-06-22 MED ORDER — FLUTICASONE PROPIONATE 50 MCG/ACT NA SUSP: 2.0000 | Freq: Every day | NASAL | 6 refills | Status: DC

## 2020-06-22 NOTE — Progress Notes (Signed)
Virtual Visit via video Note  This visit type was conducted due to national recommendations for restrictions regarding the COVID-19 pandemic (e.g. social distancing).  This format is felt to be most appropriate for this patient at this time.  All issues noted in this document were discussed and addressed.  No physical exam was performed (except for noted visual exam findings with Video Visits).   I connected with Ryan Norton today at 12:00 PM EDT by a video enabled telemedicine application and verified that I am speaking with the correct person using two identifiers. Location patient: work Location provider: work  Persons participating in the virtual visit: patient, provider  I discussed the limitations, risks, security and privacy concerns of performing an evaluation and management service by telephone and the availability of in person appointments. I also discussed with the patient that there may be a patient responsible charge related to this service. The patient expressed understanding and agreed to proceed.   Reason for visit: Same-day visit.  HPI: Congestion: Patient notes he woke up 2 days ago with complete congestion in his nostrils.  He was unable to blow any air out or breathe any air in.  His ears had pressure and have been popping.  He notes some rhinorrhea.  No cough, shortness of breath, taste or smell disturbances, or fever.  He was taking Sudafed which did help clear his nostrils out to a certain degree.  He has not been tested for COVID-19.  He has not been exposed to COVID-19.  Has not been vaccinated.  He does report a history of COVID-19 in the past.  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  He typically takes Allegra though has run out recently.   ROS: See pertinent positives and negatives per HPI.  Past Medical History:  Diagnosis Date  . Anxiety   . Asthma   . Chicken pox     Past Surgical  History:  Procedure Laterality Date  . no surgical history    . WISDOM TOOTH EXTRACTION      Family History  Problem Relation Age of Onset  . Hyperlipidemia Father   . Hypertension Father   . Diabetes Paternal Grandmother     SOCIAL HX: Non-smoker   Current Outpatient Medications:  .  albuterol (PROVENTIL HFA;VENTOLIN HFA) 108 (90 Base) MCG/ACT inhaler, Inhale 2 puffs into the lungs every 6 (six) hours as needed for wheezing or shortness of breath., Disp: 1 Inhaler, Rfl: 0 .  sertraline (ZOLOFT) 50 MG tablet, TAKE 1 TABLET BY MOUTH EVERY DAY, Disp: 90 tablet, Rfl: 2 .  cetirizine (ZYRTEC) 10 MG tablet, Take 1 tablet (10 mg total) by mouth daily., Disp: 30 tablet, Rfl: 11 .  fluticasone (FLONASE) 50 MCG/ACT nasal spray, Place 2 sprays into both nostrils daily., Disp: 16 g, Rfl: 6  EXAM:  VITALS per patient if applicable:  GENERAL: alert, oriented, appears well and in no acute distress  HEENT: atraumatic, conjunttiva clear, no obvious abnormalities on inspection of external nose and ears  NECK: normal movements of the head and neck  LUNGS: on inspection no signs of respiratory distress, breathing rate appears normal, no obvious gross SOB, gasping or wheezing  CV: no obvious cyanosis  MS: moves all visible extremities without noticeable abnormality  PSYCH/NEURO: pleasant and cooperative, no obvious depression or anxiety, speech and thought processing grossly intact  ASSESSMENT AND PLAN:  Discussed the following assessment and plan:  Head congestion Discussed symptoms could be related to allergies, viral illness, or COVID-19.  I encouraged him to get tested for COVID-19 at one of the local pharmacies and to quarantine at home until that test result returns.  Discussed treatment for allergies given his history.  We will start on Flonase and Zyrtec.  Advised to contact us for worsening symptoms and to seek medical attention for any shortness of breath or fevers.   No orders  of the defined types were placed in this encounter.   Meds ordered this encounter  Medications  . fluticasone (FLONASE) 50 MCG/ACT nasal spray    Sig: Place 2 sprays into both nostrils daily.    Dispense:  16 g    Refill:  6  . cetirizine (ZYRTEC) 10 MG tablet    Sig: Take 1 tablet (10 mg total) by mouth daily.    Dispense:  30 tablet    Refill:  11     I discussed the assessment and treatment plan with the patient. The patient was provided an opportunity to ask questions and all were answered. The patient agreed with the plan and demonstrated an understanding of the instructions.   The patient was advised to call back or seek an in-person evaluation if the symptoms worsen or if the condition fails to improve as anticipated.    Marikay Alar, MD

## 2020-06-22 NOTE — Assessment & Plan Note (Signed)
Discussed symptoms could be related to allergies, viral illness, or COVID-19.  I encouraged him to get tested for COVID-19 at one of the local pharmacies and to quarantine at home until that test result returns.  Discussed treatment for allergies given his history.  We will start on Flonase and Zyrtec.  Advised to contact us for worsening symptoms and to seek medical attention for any shortness of breath or fevers.

## 2020-10-06 ENCOUNTER — Other Ambulatory Visit: Payer: Self-pay

## 2020-10-06 ENCOUNTER — Ambulatory Visit
Admission: EM | Admit: 2020-10-06 | Discharge: 2020-10-06 | Disposition: A | Discharge status: Home / Self Care | Payer: PRIVATE HEALTH INSURANCE | Attending: Family Medicine | Admitting: Family Medicine

## 2020-10-06 ENCOUNTER — Encounter: Payer: Self-pay | Admitting: Emergency Medicine

## 2020-10-06 DIAGNOSIS — J069 Acute upper respiratory infection, unspecified: Secondary | ICD-10-CM | POA: Diagnosis present

## 2020-10-06 DIAGNOSIS — R0982 Postnasal drip: Secondary | ICD-10-CM | POA: Insufficient documentation

## 2020-10-06 LAB — GROUP A STREP BY PCR: Group A Strep by PCR: NOT DETECTED

## 2020-10-06 NOTE — ED Triage Notes (Addendum)
Patient in today c/o sore throat, headache and dizziness x 5 days. Patient denies fever. Patient has not taken any OTC medications to help with his symptoms. Patient has not had the covid vaccines. Patient had a negative PCR covid test on Monday (10/04/20).

## 2020-10-06 NOTE — Discharge Instructions (Signed)
Continue OTC antihistamine.  Restart flonase.  Strep negative.  Take care  Dr. Adriana Simas

## 2020-10-06 NOTE — ED Provider Notes (Signed)
[facility]    CSN: [id number] Arrival date & time: 10/06/20  0910      History   Chief Complaint Chief Complaint  Patient presents with  . Sore Throat  . Dizziness  . Headache   HPI  24 year old male presents with the above complaints.  Patient reports that he has been sick for the past 5 days.  Has had sore throat, headache, dizziness.  He states that he feels like there is a lump in his throat.  He is taking his normal allergy medications.  No other medications or interventions tried.  No fever.  Has recently had negative Covid and flu test.  No known exacerbating factors.  No other reported symptoms.  Past Medical History:  Diagnosis Date  . Anxiety   . Asthma   . Chicken pox     Patient Active Problem List   Diagnosis Date Noted  . Anxiety and depression 06/02/2019  . Headache 06/02/2019  . Vertigo 05/31/2017  . Head congestion 01/25/2017  . Routine general medical examination at a health care facility 07/11/2016  . Preventative health care 08/06/2015  . Asthma 08/06/2015    Past Surgical History:  Procedure Laterality Date  . no surgical history    . WISDOM TOOTH EXTRACTION         Home Medications    Prior to Admission medications   Medication Sig Start Date End Date Taking? Authorizing Provider  albuterol (PROVENTIL HFA;VENTOLIN HFA) 108 (90 Base) MCG/ACT inhaler Inhale 2 puffs into the lungs every 6 (six) hours as needed for wheezing or shortness of breath. 08/30/18  Yes Glori Luis, MD  cetirizine (ZYRTEC) 10 MG tablet Take 1 tablet (10 mg total) by mouth daily. 06/22/20  Yes Glori Luis, MD  fluticasone (FLONASE) 50 MCG/ACT nasal spray Place 2 sprays into both nostrils daily. 06/22/20  Yes Glori Luis, MD  sertraline (ZOLOFT) 50 MG tablet TAKE 1 TABLET BY MOUTH EVERY DAY 03/15/20  Yes Glori Luis, MD    Family History Family History  Problem Relation Age of Onset  . Hyperlipidemia Father   . Hypertension  Father   . Diabetes Paternal Grandmother   . Healthy Mother     Social History Social History   Tobacco Use  . Smoking status: Never Smoker  . Smokeless tobacco: Never Used  Vaping Use  . Vaping Use: Never used  Substance Use Topics  . Alcohol use: No    Alcohol/week: 0.0 standard drinks  . Drug use: No     Allergies   Patient has no known allergies.   Review of Systems Review of Systems  HENT: Positive for sore throat.   Neurological: Positive for dizziness and headaches.   Physical Exam Triage Vital Signs ED Triage Vitals  Enc Vitals Group     BP 10/06/20 0954 (!) 133/97     Pulse Rate 10/06/20 0954 70     Resp 10/06/20 0954 18     Temp 10/06/20 0954 98.6 F (37 C)     Temp Source 10/06/20 0954 Oral     SpO2 10/06/20 0954 99 %     Weight 10/06/20 0954 180 lb (81.6 kg)     Height 10/06/20 0954 5\' 7"  (1.702 m)     Head Circumference --      Peak Flow --      Pain Score 10/06/20 0953 4     Pain Loc --      Pain Edu? --  Excl. in GC? --    Updated Vital Signs BP (!) 133/97 (BP Location: Left Arm)   Pulse 70   Temp 98.6 F (37 C) (Oral)   Resp 18   Ht 5\' 7"  (1.702 m)   Wt 81.6 kg   SpO2 99%   BMI 28.19 kg/m   Visual Acuity Right Eye Distance:   Left Eye Distance:   Bilateral Distance:    Right Eye Near:   Left Eye Near:    Bilateral Near:     Physical Exam Vitals and nursing note reviewed.  Constitutional:      General: He is not in acute distress.    Appearance: He is well-developed. He is not ill-appearing.  HENT:     Head: Normocephalic and atraumatic.     Mouth/Throat:     Pharynx: Posterior oropharyngeal erythema present. No oropharyngeal exudate.  Eyes:     Conjunctiva/sclera: Conjunctivae normal.  Cardiovascular:     Rate and Rhythm: Normal rate and regular rhythm.     Heart sounds: No murmur heard.   Pulmonary:     Effort: Pulmonary effort is normal.     Breath sounds: Normal breath sounds. No wheezing, rhonchi or rales.   Neurological:     Mental Status: He is alert.  Psychiatric:        Mood and Affect: Mood normal.        Behavior: Behavior normal.    UC Treatments / Results  Labs (all labs ordered are listed, but only abnormal results are displayed) Labs Reviewed  GROUP A STREP BY PCR    EKG   Radiology No results found.  Procedures Procedures (including critical care time)  Medications Ordered in UC Medications - No data to display  Initial Impression / Assessment and Plan / UC Course  I have reviewed the triage vital signs and the nursing notes.  Pertinent labs & imaging results that were available during my care of the patient were reviewed by me and considered in my medical decision making (see chart for details).    24 year old male presents with a respiratory infection and postnasal drip.  Strep negative.  Advised to continue over-the-counter antihistamine.  Restart Flonase.  May return to work.  Final Clinical Impressions(s) / UC Diagnoses   Final diagnoses:  Post-nasal drip     Discharge Instructions     Continue OTC antihistamine.  Restart flonase.  Strep negative.  Take care  Dr. 25   ED Prescriptions    None     PDMP not reviewed this encounter.   Adriana Simas, DO 10/06/20 1308

## 2020-11-01 ENCOUNTER — Other Ambulatory Visit: Payer: Self-pay

## 2020-11-01 ENCOUNTER — Ambulatory Visit
Admission: EM | Admit: 2020-11-01 | Discharge: 2020-11-01 | Disposition: A | Discharge status: Home / Self Care | Payer: PRIVATE HEALTH INSURANCE | Attending: Family Medicine | Admitting: Family Medicine

## 2020-11-01 ENCOUNTER — Encounter: Payer: Self-pay | Admitting: Emergency Medicine

## 2020-11-01 DIAGNOSIS — J069 Acute upper respiratory infection, unspecified: Secondary | ICD-10-CM

## 2020-11-01 DIAGNOSIS — U071 COVID-19: Secondary | ICD-10-CM | POA: Diagnosis not present

## 2020-11-01 NOTE — ED Provider Notes (Signed)
[facility]    CSN: [id number] Arrival date & time: 11/01/20  1240      History   Chief Complaint Chief Complaint  Patient presents with  . Cough  . Nasal Congestion         HPI LENN VOLKER is a 25 y.o. male.   HPI   25 year old male here for confirmatory testing after testing positive for COVID at home.  Patient reports that for the last 3 days he has been dealing with runny nose and nasal congestion, headache, productive cough for clear sputum and shortness of breath.  Patient denies fever, sore throat, wheezing, ear pain or pressure, GI symptoms, changes to her sense of taste or smell, or body aches.  Patient is also curious about monoclonal antibodies.  Past Medical History:  Diagnosis Date  . Anxiety   . Asthma   . Chicken pox     Patient Active Problem List   Diagnosis Date Noted  . Anxiety and depression 06/02/2019  . Headache 06/02/2019  . Vertigo 05/31/2017  . Head congestion 01/25/2017  . Routine general medical examination at a health care facility 07/11/2016  . Preventative health care 08/06/2015  . Asthma 08/06/2015    Past Surgical History:  Procedure Laterality Date  . no surgical history    . WISDOM TOOTH EXTRACTION         Home Medications    Prior to Admission medications   Medication Sig Start Date End Date Taking? Authorizing Provider  albuterol (PROVENTIL HFA;VENTOLIN HFA) 108 (90 Base) MCG/ACT inhaler Inhale 2 puffs into the lungs every 6 (six) hours as needed for wheezing or shortness of breath. 08/30/18  Yes Glori Luis, MD  cetirizine (ZYRTEC) 10 MG tablet Take 1 tablet (10 mg total) by mouth daily. 06/22/20  Yes Glori Luis, MD  fluticasone (FLONASE) 50 MCG/ACT nasal spray Place 2 sprays into both nostrils daily. 06/22/20  Yes Glori Luis, MD  sertraline (ZOLOFT) 50 MG tablet TAKE 1 TABLET BY MOUTH EVERY DAY 03/15/20  Yes Glori Luis, MD    Family History Family History  Problem  Relation Age of Onset  . Hyperlipidemia Father   . Hypertension Father   . Diabetes Paternal Grandmother   . Healthy Mother     Social History Social History   Tobacco Use  . Smoking status: Never Smoker  . Smokeless tobacco: Never Used  Vaping Use  . Vaping Use: Never used  Substance Use Topics  . Alcohol use: No    Alcohol/week: 0.0 standard drinks  . Drug use: No     Allergies   Patient has no known allergies.   Review of Systems Review of Systems  Constitutional: Negative for activity change, appetite change and fever.  HENT: Positive for congestion and rhinorrhea. Negative for ear pain and sore throat.   Respiratory: Positive for cough and shortness of breath. Negative for wheezing.   Gastrointestinal: Negative for diarrhea, nausea and vomiting.  Musculoskeletal: Negative for arthralgias and myalgias.  Skin: Negative for rash.  Neurological: Positive for headaches.  Hematological: Negative.   Psychiatric/Behavioral: Negative.      Physical Exam Triage Vital Signs ED Triage Vitals  Enc Vitals Group     BP 11/01/20 1502 (!) 142/94     Pulse Rate 11/01/20 1502 87     Resp 11/01/20 1502 18     Temp 11/01/20 1502 98.3 F (36.8 C)     Temp Source 11/01/20 1502 Oral  SpO2 11/01/20 1502 99 %     Weight 11/01/20 1459 179 lb 14.3 oz (81.6 kg)     Height 11/01/20 1459 5\' 7"  (1.702 m)     Head Circumference --      Peak Flow --      Pain Score 11/01/20 1459 2     Pain Loc --      Pain Edu? --      Excl. in GC? --    No data found.  Updated Vital Signs BP (!) 142/94 (BP Location: Left Arm)   Pulse 87   Temp 98.3 F (36.8 C) (Oral)   Resp 18   Ht 5\' 7"  (1.702 m)   Wt 179 lb 14.3 oz (81.6 kg)   SpO2 99%   BMI 28.18 kg/m   Visual Acuity Right Eye Distance:   Left Eye Distance:   Bilateral Distance:    Right Eye Near:   Left Eye Near:    Bilateral Near:     Physical Exam Vitals and nursing note reviewed.  Constitutional:      General: He is  not in acute distress.    Appearance: Normal appearance. He is normal weight. He is not toxic-appearing.  HENT:     Head: Normocephalic and atraumatic.     Right Ear: Tympanic membrane, ear canal and external ear normal.     Left Ear: Tympanic membrane, ear canal and external ear normal.     Nose: Congestion and rhinorrhea present.     Comments: Nasal mucosa is erythematous and edematous with scant clear nasal discharge.    Mouth/Throat:     Mouth: Mucous membranes are moist.     Pharynx: Oropharynx is clear. No oropharyngeal exudate or posterior oropharyngeal erythema.  Cardiovascular:     Rate and Rhythm: Normal rate and regular rhythm.     Pulses: Normal pulses.     Heart sounds: Normal heart sounds. No murmur heard. No gallop.   Pulmonary:     Effort: Pulmonary effort is normal.     Breath sounds: Normal breath sounds. No rhonchi or rales.  Musculoskeletal:     Cervical back: Normal range of motion and neck supple.  Lymphadenopathy:     Cervical: No cervical adenopathy.  Skin:    General: Skin is warm and dry.     Capillary Refill: Capillary refill takes less than 2 seconds.     Findings: No erythema or rash.  Neurological:     General: No focal deficit present.     Mental Status: He is alert and oriented to person, place, and time.  Psychiatric:        Mood and Affect: Mood normal.        Behavior: Behavior normal.        Thought Content: Thought content normal.        Judgment: Judgment normal.      UC Treatments / Results  Labs (all labs ordered are listed, but only abnormal results are displayed) Labs Reviewed  SARS CORONAVIRUS 2 (TAT 6-24 HRS)    EKG   Radiology No results found.  Procedures Procedures (including critical care time)  Medications Ordered in UC Medications - No data to display  Initial Impression / Assessment and Plan / UC Course  I have reviewed the triage vital signs and the nursing notes.  Pertinent labs & imaging results that  were available during my care of the patient were reviewed by me and considered in my medical decision making (see chart  for details).   Patient is here for confirmatory COVID testing after testing positive at home.  He needs a test for employer.  Patient is nontoxic in appearance but he does have mucosal edema and erythema of his nasal passages as well as some clear nasal discharge.  Lungs are clear to auscultation all fields.  Will swab for COVID and discharge patient home to quarantine for 5 days.  Patient to use over-the-counter Tylenol and ibuprofen as needed for body aches, over-the-counter Robitussin or Delsym for cough, and supportive care.   Final Clinical Impressions(s) / UC Diagnoses   Final diagnoses:  Viral URI with cough     Discharge Instructions     Isolate at home until the results of your COVID test are back.  If you test positive then you will need to quarantine for 5 days from the onset of your symptoms.  After 5 days you can break quarantine if your symptoms have improved and you have not had a fever for 24 hours.  You will need to wear a mask around other people for 5 days after clearing quarantine.  Use over-the-counter Tylenol and ibuprofen as needed for fever and body aches.  Use over-the-counter Robitussin, Zarbee's, or Delsym for cough and congestion.  If you develop any worsening shortness of breath-especially at rest, are unable to speak in full sentences, or develop bluing of your lips need go the ER for evaluation.    ED Prescriptions    None     PDMP not reviewed this encounter.   Becky Augusta, NP 11/01/20 1558

## 2020-11-01 NOTE — Discharge Instructions (Addendum)
Isolate at home until the results of your COVID test are back.  If you test positive then you will need to quarantine for 5 days from the onset of your symptoms.  After 5 days you can break quarantine if your symptoms have improved and you have not had a fever for 24 hours.  You will need to wear a mask around other people for 5 days after clearing quarantine.  Use over-the-counter Tylenol and ibuprofen as needed for fever and body aches.  Use over-the-counter Robitussin, Zarbee's, or Delsym for cough and congestion.  If you develop any worsening shortness of breath-especially at rest, are unable to speak in full sentences, or develop bluing of your lips need go the ER for evaluation.

## 2020-11-01 NOTE — ED Triage Notes (Signed)
Pt c/o nasal congestion, runny nose, headache, and shortness of breath. Started about 3 days ago. Denies fever. He took a home test and was positive, but he needs a confirmation per his employer.

## 2020-11-02 LAB — SARS CORONAVIRUS 2 (TAT 6-24 HRS): SARS Coronavirus 2: POSITIVE — AB

## 2020-11-10 ENCOUNTER — Encounter: Payer: Self-pay | Admitting: Family Medicine

## 2020-11-10 ENCOUNTER — Telehealth (INDEPENDENT_AMBULATORY_CARE_PROVIDER_SITE_OTHER): Payer: No Typology Code available for payment source | Admitting: Family Medicine

## 2020-11-10 DIAGNOSIS — E663 Overweight: Secondary | ICD-10-CM

## 2020-11-10 DIAGNOSIS — E669 Obesity, unspecified: Secondary | ICD-10-CM | POA: Insufficient documentation

## 2020-11-10 DIAGNOSIS — E66811 Obesity, class 1: Secondary | ICD-10-CM | POA: Insufficient documentation

## 2020-11-10 DIAGNOSIS — F32A Depression, unspecified: Secondary | ICD-10-CM | POA: Diagnosis not present

## 2020-11-10 DIAGNOSIS — R41840 Attention and concentration deficit: Secondary | ICD-10-CM

## 2020-11-10 DIAGNOSIS — F419 Anxiety disorder, unspecified: Secondary | ICD-10-CM | POA: Diagnosis not present

## 2020-11-10 NOTE — Assessment & Plan Note (Addendum)
Much better managed at this time.  Discussed coming off of Zoloft.  Given the low dose I advised he could discontinue this.  If he has recurrence of anxiety he could go back on the Zoloft.  The patient will also try to find out where he had his prior ADHD evaluation.

## 2020-11-10 NOTE — Progress Notes (Addendum)
Virtual Visit via video Note  This visit type was conducted due to national recommendations for restrictions regarding the COVID-19 pandemic (e.g. social distancing).  This format is felt to be most appropriate for this patient at this time.  All issues noted in this document were discussed and addressed.  No physical exam was performed (except for noted visual exam findings with Video Visits).   I connected with Ryan Norton today at  9:30 AM EST by a video enabled telemedicine application and verified that I am speaking with the correct person using two identifiers. Location patient: work Location provider: home office Persons participating in the virtual visit: patient, provider  I discussed the limitations, risks, security and privacy concerns of performing an evaluation and management service by telephone and the availability of in person appointments. I also discussed with the patient that there may be a patient responsible charge related to this service. The patient expressed understanding and agreed to proceed.  Reason for visit: Follow-up.  HPI: Anxiety: Patient notes this is quite a bit more manageable than it was previously.  He notes he has gone several days at a time without his Zoloft and notes he has done well with this.  He unsure if he would be able to come off of this medication at this time.  He does report a history of ADHD and wonders if going on ADHD medication would help him stick with things.  Overweight: Patient notes its been hard for him to stick with diet and exercise changes.  He has attempted to make these though always reverts.  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  He notes he is trying to eat less quantities of food and less junk food.   ROS: See pertinent positives and negatives per HPI.  Past Medical History:  Diagnosis Date  . Anxiety   . Asthma   . Chicken pox     Past Surgical History:  Procedure  Laterality Date  . no surgical history    . WISDOM TOOTH EXTRACTION      Family History  Problem Relation Age of Onset  . Hyperlipidemia Father   . Hypertension Father   . Diabetes Paternal Grandmother   . Healthy Mother     SOCIAL HX: Non-smoker   Current Outpatient Medications:  .  albuterol (PROVENTIL HFA;VENTOLIN HFA) 108 (90 Base) MCG/ACT inhaler, Inhale 2 puffs into the lungs every 6 (six) hours as needed for wheezing or shortness of breath., Disp: 1 Inhaler, Rfl: 0 .  cetirizine (ZYRTEC) 10 MG tablet, Take 1 tablet (10 mg total) by mouth daily., Disp: 30 tablet, Rfl: 11 .  fluticasone (FLONASE) 50 MCG/ACT nasal spray, Place 2 sprays into both nostrils daily., Disp: 16 g, Rfl: 6 .  sertraline (ZOLOFT) 50 MG tablet, TAKE 1 TABLET BY MOUTH EVERY DAY, Disp: 90 tablet, Rfl: 2  EXAM:  VITALS per patient if applicable:  GENERAL: alert, oriented, appears well and in no acute distress  HEENT: atraumatic, conjunttiva clear, no obvious abnormalities on inspection of external nose and ears  NECK: normal movements of the head and neck  LUNGS: on inspection no signs of respiratory distress, breathing rate appears normal, no obvious gross SOB, gasping or wheezing  CV: no obvious cyanosis  MS: moves all visible extremities without noticeable abnormality  PSYCH/NEURO: pleasant and cooperative, no obvious depression or anxiety, speech and thought processing grossly intact  ASSESSMENT AND PLAN:  Discussed the following assessment and plan:  Problem List Items Addressed This Visit    Anxiety and depression    Much better managed at this time.  Discussed coming off of Zoloft.  Given the low dose I advised he could discontinue this.  If he has recurrence of anxiety he could go back on the Zoloft.  The patient will also try to find out where he had his prior ADHD evaluation.      Attention deficit    Patient with difficulty with attention and hyperactivity.  Notes prior history of  ADHD.  He will try to track down his records.  Will refer for further evaluation in case he is not able to get his records for [place].      Relevant Orders   Ambulatory referral to Psychology   Overweight    Patient notes progressive weight gain.  Encouraged getting at least 3 days of exercise weekly.  Advised on a calorie goal of 1500-2000 cal/day for a 1 to 2 pound weight loss per week.  Discussed that discontinuing the Zoloft may also make a difference in his ability to lose weight.  Follow-up in 2 to 3 months.          I discussed the assessment and treatment plan with the patient. The patient was provided an opportunity to ask questions and all were answered. The patient agreed with the plan and demonstrated an understanding of the instructions.   The patient was advised to call back or seek an in-person evaluation if the symptoms worsen or if the condition fails to improve as anticipated.    Marikay Alar, MD

## 2020-11-10 NOTE — Addendum Note (Signed)
Addended by: Birdie Sons, Timtohy Broski G on: 11/10/2020 10:09 AM   Modules accepted: Orders

## 2020-11-10 NOTE — Assessment & Plan Note (Signed)
Patient with difficulty with attention and hyperactivity.  Notes prior history of ADHD.  He will try to track down his records.  Will refer for further evaluation in case he is not able to get his records for [place].

## 2020-11-10 NOTE — Assessment & Plan Note (Signed)
Patient notes progressive weight gain.  Encouraged getting at least 3 days of exercise weekly.  Advised on a calorie goal of 1500-2000 cal/day for a 1 to 2 pound weight loss per week.  Discussed that discontinuing the Zoloft may also make a difference in his ability to lose weight.  Follow-up in 2 to 3 months.

## 2020-12-08 ENCOUNTER — Telehealth: Payer: Self-pay | Admitting: Family Medicine

## 2020-12-08 NOTE — Telephone Encounter (Signed)
lft vm at [facility]  To follow up on referral.

## 2020-12-17 ENCOUNTER — Other Ambulatory Visit: Payer: Self-pay | Admitting: Family Medicine

## 2021-02-11 ENCOUNTER — Other Ambulatory Visit: Payer: Self-pay | Admitting: Family Medicine

## 2021-05-18 ENCOUNTER — Other Ambulatory Visit: Payer: Self-pay | Admitting: Family Medicine

## 2021-05-23 ENCOUNTER — Other Ambulatory Visit: Payer: Self-pay | Admitting: Family Medicine

## 2021-05-25 ENCOUNTER — Telehealth: Payer: Self-pay | Admitting: Family Medicine

## 2021-05-25 NOTE — Telephone Encounter (Signed)
Patient is requesting a refill on his sertraline, 50mg , tablet. Patient stated Dr refilled in the pass, I do not see it on patient's current med list.

## 2021-05-26 ENCOUNTER — Other Ambulatory Visit: Payer: Self-pay | Admitting: Internal Medicine

## 2021-05-26 MED ORDER — SERTRALINE HCL 50 MG PO TABS: 50.0000 mg | ORAL_TABLET | Freq: Every day | ORAL | 0 refills | Status: DC

## 2021-05-26 NOTE — Telephone Encounter (Signed)
Filled 90 day will need to f/u with PCP for mood/anxiety

## 2021-05-26 NOTE — Telephone Encounter (Signed)
Pt called to follow up on refill  He is out of medication  

## 2021-05-26 NOTE — Telephone Encounter (Signed)
Patient called about the statues of his refill. Is is out of medication.

## 2021-05-26 NOTE — Telephone Encounter (Signed)
I called the patient and informed him that his medication was sent to the pharmacy and he also scheduled a follow up to see the provider while on the phone with me.  Cono Gebhard,cma

## 2021-06-10 ENCOUNTER — Encounter: Payer: Self-pay | Admitting: Family Medicine

## 2021-06-10 ENCOUNTER — Other Ambulatory Visit: Payer: Self-pay

## 2021-06-10 ENCOUNTER — Ambulatory Visit (INDEPENDENT_AMBULATORY_CARE_PROVIDER_SITE_OTHER): Payer: No Typology Code available for payment source | Admitting: Family Medicine

## 2021-06-10 DIAGNOSIS — F32A Depression, unspecified: Secondary | ICD-10-CM

## 2021-06-10 DIAGNOSIS — G44209 Tension-type headache, unspecified, not intractable: Secondary | ICD-10-CM | POA: Diagnosis not present

## 2021-06-10 DIAGNOSIS — F419 Anxiety disorder, unspecified: Secondary | ICD-10-CM | POA: Diagnosis not present

## 2021-06-10 MED ORDER — SERTRALINE HCL 50 MG PO TABS: 50.0000 mg | ORAL_TABLET | Freq: Every day | ORAL | 2 refills | Status: DC

## 2021-06-10 NOTE — Assessment & Plan Note (Signed)
Chronic issue.  Seems to be generally well controlled on Zoloft 50 mg once daily.  Continue this dosing of Zoloft.  We will follow-up in 6 months.

## 2021-06-10 NOTE — Patient Instructions (Signed)
Nice to see you. I refilled your Zoloft. If your anxiety worsens please let us know.

## 2021-06-10 NOTE — Assessment & Plan Note (Signed)
History of tension headaches.  I suspect the anxiety will use given that his headaches have responded quite well to Zoloft.  He will on Zoloft 50 mg once daily.

## 2021-06-10 NOTE — Progress Notes (Signed)
Marikay Alar, MD Phone: [phone]  Ryan Norton is a 25 y.o. male who presents today for follow-up.  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  He notes no SI.  He has been on Zoloft.  He did stop the Zoloft for a couple of days and notes he had recurrence of headaches come off of it.  Headaches: Patient notes chronic history of headaches with a slight pressure sensation associated with dizziness and lightheadedness.  He noted recurrence of these symptoms when he tried to stop the Zoloft for a few days.  This was going on prior to going on Zoloft and notes he was placed on Zoloft to help with this.  When he takes the Zoloft consistently does not have any issues with these.  Social History   Tobacco Use  Smoking Status Never  Smokeless Tobacco Never    Current Outpatient Medications on File Prior to Visit  Medication Sig Dispense Refill   albuterol (PROVENTIL HFA;VENTOLIN HFA) 108 (90 Base) MCG/ACT inhaler Inhale 2 puffs into the lungs every 6 (six) hours as needed for wheezing or shortness of breath. 1 Inhaler 0   cetirizine (ZYRTEC) 10 MG tablet TAKE 1 TABLET BY MOUTH EVERY DAY 90 tablet 3   fluticasone (FLONASE) 50 MCG/ACT nasal spray SPRAY 2 SPRAYS INTO EACH NOSTRIL EVERY DAY 48 mL 2   No current facility-administered medications on file prior to visit.     ROS see history of present illness  Objective  Physical Exam Vitals:   06/10/21 1530  BP: 120/80  Pulse: 72  Temp: 98.7 F (37.1 C)  SpO2: 98%    BP Readings from Last 3 Encounters:  06/10/21 120/80  11/01/20 (!) 142/94  10/06/20 (!) 133/97   Wt Readings from Last 3 Encounters:  06/10/21 204 lb 3.2 oz (92.6 kg)  11/10/20 190 lb (86.2  kg)  11/01/20 179 lb 14.3 oz (81.6 kg)    Physical Exam Constitutional:      General: He is not in acute distress.    Appearance: He is not diaphoretic.  Cardiovascular:     Rate and Rhythm: Normal rate and regular rhythm.     Heart sounds: Normal heart sounds.  Pulmonary:     Effort: Pulmonary effort is normal.     Breath sounds: Normal breath sounds.  Skin:    General: Skin is warm and dry.  Neurological:     Mental Status: He is alert.     Assessment/Plan: Please see individual problem list.  Problem List Items Addressed This Visit     Anxiety and depression (Chronic)    Chronic issue.  Seems to be generally well controlled on Zoloft 50 mg once daily.  Continue this dosing of Zoloft.  We will follow-up in 6 months.      Relevant Medications   sertraline (ZOLOFT) 50 MG tablet   Headache (Chronic)    History of tension headaches.  I suspect the anxiety will use given that his headaches have responded quite well to Zoloft.  He will on Zoloft 50 mg once daily.      Relevant Medications   sertraline (ZOLOFT) 50 MG tablet    Return in about 6 months (  around 12/11/2021) for Anxiety.  This visit occurred during the SARS-CoV-2 public health emergency.  Safety protocols were in place, including screening questions prior to the visit, additional usage of staff PPE, and extensive cleaning of exam room while observing appropriate contact time as indicated for disinfecting solutions.    Marikay Alar, MD [facility]

## 2021-06-29 ENCOUNTER — Other Ambulatory Visit: Payer: Self-pay

## 2021-06-29 ENCOUNTER — Encounter: Payer: Self-pay | Admitting: Family Medicine

## 2021-06-29 ENCOUNTER — Ambulatory Visit (INDEPENDENT_AMBULATORY_CARE_PROVIDER_SITE_OTHER): Payer: No Typology Code available for payment source | Admitting: Family Medicine

## 2021-06-29 VITALS — BP 128/86 | HR 71 | Temp 96.6°F | Ht 68.11 in | Wt 203.2 lb

## 2021-06-29 DIAGNOSIS — Z13220 Encounter for screening for lipoid disorders: Secondary | ICD-10-CM | POA: Diagnosis not present

## 2021-06-29 DIAGNOSIS — Z0000 Encounter for general adult medical examination without abnormal findings: Secondary | ICD-10-CM

## 2021-06-29 DIAGNOSIS — E669 Obesity, unspecified: Secondary | ICD-10-CM

## 2021-06-29 DIAGNOSIS — Z23 Encounter for immunization: Secondary | ICD-10-CM

## 2021-06-29 LAB — COMPREHENSIVE METABOLIC PANEL
ALT: 71 U/L — ABNORMAL HIGH (ref 0–53)
AST: 27 U/L (ref 0–37)
Albumin: 4.3 g/dL (ref 3.5–5.2)
Alkaline Phosphatase: 98 U/L (ref 39–117)
BUN: 13 mg/dL (ref 6–23)
CO2: 27 mEq/L (ref 19–32)
Calcium: 9.7 mg/dL (ref 8.4–10.5)
Chloride: 103 mEq/L (ref 96–112)
Creatinine, Ser: 0.98 mg/dL (ref 0.40–1.50)
GFR: 107.09 mL/min (ref >60.00)
Glucose, Bld: 99 mg/dL (ref 70–99)
Potassium: 4.1 mEq/L (ref 3.5–5.1)
Sodium: 139 mEq/L (ref 135–145)
Total Bilirubin: 0.5 mg/dL (ref 0.2–1.2)
Total Protein: 7.1 g/dL (ref 6.0–8.3)

## 2021-06-29 LAB — LIPID PANEL
Cholesterol: 185 mg/dL (ref 0–200)
HDL: 36.1 mg/dL — ABNORMAL LOW (ref >39.00)
LDL Cholesterol: 116 mg/dL — ABNORMAL HIGH (ref 0–99)
NonHDL: 149.06
Total CHOL/HDL Ratio: 5
Triglycerides: 167 mg/dL — ABNORMAL HIGH (ref 0.0–149.0)
VLDL: 33.4 mg/dL (ref 0.0–40.0)

## 2021-06-29 LAB — HEMOGLOBIN A1C: Hgb A1c MFr Bld: 5.6 % (ref 4.6–6.5)

## 2021-06-29 NOTE — Patient Instructions (Signed)
Nice to see you. Please work on diet and exercise as we discussed.

## 2021-06-29 NOTE — Assessment & Plan Note (Signed)
Physical exam completed.  I encouraged healthy diet.  Discussed cutting down on junk food and sweets.  Discussed trying to do meal prepping so that he gets some healthier options.  Discussed eating healthier options if he does have to eat out.  Discussed increasing exercise.  The patient was given a flu vaccine today.  He was counseled on the COVID-vaccine and HPV vaccines and declines both of those.  He declines HIV screening.  He notes there is no risk for that.  Lab work as outlined.  He will monitor his aphthous ulcers and if they worsen or one of them does not resolve he will let us know.

## 2021-06-29 NOTE — Progress Notes (Signed)
Tommi Rumps, MD Phone: [phone]  Ryan Norton is a 25 y.o. male who presents today for CPE.  Diet: "not great." Likes vegetables but they take too long to prepare. Does eat too many sweets and junk. Some soda.  Exercise: increasing in the near future with soccer season Colonoscopy: not indicated Prostate cancer screening: not indicated Family history-  Prostate cancer: no  Colon cancer: no Sexually active: no, reports no prior sexual activity, fiance has not been sexually active either Vaccines-   Flu: due  Tetanus: UTD  COVID19: declines  HPV: declines HIV screening: declines Hep C Screening: UTD Tobacco use: no Alcohol use: no Illicit Drug use: no Dentist: no Ophthalmology: no  Aphthous ulcers: Patient reports having these intermittently.  They occasionally occur in his mouth.  He uses Orajel on them.  He notes a family history of these.  He will go weeks without having any issues.  Active Ambulatory Problems    Diagnosis Date Noted   Preventative health care 08/06/2015   Asthma 08/06/2015   Routine general medical examination at a health care facility 07/11/2016   Head congestion 01/25/2017   Vertigo 05/31/2017   Anxiety and depression 06/02/2019   Headache 06/02/2019   Obesity (BMI 30.0-34.9) 11/10/2020   Attention deficit 11/10/2020   Resolved Ambulatory Problems    Diagnosis Date Noted   URI (upper respiratory infection) 09/27/2016   Canker sore 09/27/2016   Past Medical History:  Diagnosis Date   Anxiety    Chicken pox     Family History  Problem Relation Age of Onset   Hyperlipidemia Father    Hypertension Father    Diabetes Paternal Grandmother    Healthy Mother     Social History   Socioeconomic History   Marital status: Single    Spouse name: Not on file   Number of children: Not on file   Years of education: Not on file   Highest education level: Not on file  Occupational History   Not on file  Tobacco Use   Smoking  status: Never   Smokeless tobacco: Never  Vaping Use   Vaping Use: Never used  Substance and Sexual Activity   Alcohol use: No    Alcohol/week: 0.0 standard drinks   Drug use: No   Sexual activity: Never  Other Topics Concern   Not on file  Social History Narrative   Not on file   Social Determinants of Health   Financial Resource Strain: Not on file  Food Insecurity: Not on file  Transportation Needs: Not on file  Physical Activity: Not on file  Stress: Not on file  Social Connections: Not on file  Intimate Partner Violence: Not on file    ROS  General:  Negative for nexplained weight loss, fever Skin: Negative for new or changing mole, sore that won't heal HEENT: Positive for mouth sores, negative for trouble hearing, trouble seeing, ringing in ears, hoarseness, change in voice, dysphagia. CV:  Negative for chest pain, dyspnea, edema, palpitations Resp: Negative for cough, dyspnea, hemoptysis GI: Negative for nausea, vomiting, diarrhea, constipation, abdominal pain, melena, hematochezia. GU: Negative for dysuria, incontinence, urinary hesitance, hematuria, vaginal or penile discharge, polyuria, sexual difficulty, lumps in testicle or breasts MSK: Negative for muscle cramps or aches, joint pain or swelling Neuro: Negative for headaches, weakness, numbness, dizziness, passing out/fainting Psych: Negative for depression, anxiety, memory problems  Objective  Physical Exam Vitals:   06/29/21 0809  BP: 128/86  Pulse: 71  Temp: (!) 96.6 F (  35.9 C)  SpO2: 97%    BP Readings from Last 3 Encounters:  06/29/21 128/86  06/10/21 120/80  11/01/20 (!) 142/94   Wt Readings from Last 3 Encounters:  06/29/21 203 lb 3.2 oz (92.2 kg)  06/10/21 204 lb 3.2 oz (92.6 kg)  11/10/20 190 lb (86.2 kg)    Physical Exam Constitutional:      General: He is not in acute distress.    Appearance: He is not diaphoretic.  HENT:     Head: Normocephalic and atraumatic.  Eyes:      Conjunctiva/sclera: Conjunctivae normal.     Pupils: Pupils are equal, round, and reactive to light.  Cardiovascular:     Rate and Rhythm: Normal rate and regular rhythm.     Heart sounds: Normal heart sounds.  Pulmonary:     Effort: Pulmonary effort is normal.     Breath sounds: Normal breath sounds.  Abdominal:     General: Bowel sounds are normal. There is no distension.     Palpations: Abdomen is soft.     Tenderness: There is no abdominal tenderness. There is no guarding or rebound.  Musculoskeletal:     Right lower leg: No edema.     Left lower leg: No edema.  Lymphadenopathy:     Cervical: No cervical adenopathy.  Skin:    General: Skin is warm and dry.  Neurological:     Mental Status: He is alert.  Psychiatric:        Mood and Affect: Mood normal.     Assessment/Plan:   Problem List Items Addressed This Visit     Obesity (BMI 30.0-34.9)   Relevant Orders   HgB A1c   Routine general medical examination at a health care facility - Primary    Physical exam completed.  I encouraged healthy diet.  Discussed cutting down on junk food and sweets.  Discussed trying to do meal prepping so that he gets some healthier options.  Discussed eating healthier options if he does have to eat out.  Discussed increasing exercise.  The patient was given a flu vaccine today.  He was counseled on the COVID-vaccine and HPV vaccines and declines both of those.  He declines HIV screening.  He notes there is no risk for that.  Lab work as outlined.  He will monitor his aphthous ulcers and if they worsen or one of them does not resolve he will let us know.      Other Visit Diagnoses     Lipid screening       Relevant Orders   Lipid panel   Comp Met (CMET)       Return for As scheduled in February.  This visit occurred during the SARS-CoV-2 public health emergency.  Safety protocols were in place, including screening questions prior to the visit, additional usage of staff PPE, and  extensive cleaning of exam room while observing appropriate contact time as indicated for disinfecting solutions.    Tommi Rumps, MD [place]

## 2021-07-11 ENCOUNTER — Other Ambulatory Visit: Payer: Self-pay | Admitting: Family Medicine

## 2021-07-11 DIAGNOSIS — R945 Abnormal results of liver function studies: Secondary | ICD-10-CM

## 2021-07-11 DIAGNOSIS — R7989 Other specified abnormal findings of blood chemistry: Secondary | ICD-10-CM

## 2021-08-31 ENCOUNTER — Other Ambulatory Visit: Payer: Self-pay

## 2021-08-31 ENCOUNTER — Ambulatory Visit (INDEPENDENT_AMBULATORY_CARE_PROVIDER_SITE_OTHER): Payer: No Typology Code available for payment source | Admitting: Gastroenterology

## 2021-08-31 ENCOUNTER — Encounter: Payer: Self-pay | Admitting: Gastroenterology

## 2021-08-31 VITALS — BP 120/80 | HR 65 | Temp 97.7°F | Ht 68.0 in | Wt 206.0 lb

## 2021-08-31 DIAGNOSIS — R748 Abnormal levels of other serum enzymes: Secondary | ICD-10-CM | POA: Diagnosis not present

## 2021-08-31 NOTE — Progress Notes (Signed)
Primary Care Physician: Glori Luis, MD  Primary Gastroenterologist:  Dr. Midge Minium  Chief Complaint  Patient presents with   New Patient (Initial Visit)    Elevated liver enzymes    HPI: Ryan Norton is a 25 y.o. male here for follow-up of abnormal liver enzymes.  The patient see me in the past for abnormal liver enzymes and the enzymes at that time more fluctuating between elevated and normal.  The patient had reported that time he was taking anti-inflammatory medications that have been known to cause abnormal liver enzymes and he had stopped them prior to his liver enzymes are back to normal. Over the same period of time patient's triglycerides and LDL have increased along with his liver enzymes.  His hepatitis A antibody was positive indicating immunity when last seen in 2020 while his antibody to hepatitis B was negative.   The patient was ordered a ultrasound last time he had seen me but did not have it done.  The patient denies taking any new medications or any changes in his diet.  The patient's weight has increased considerably over the years and he was 159 pounds in October 2016 and today he comes in at 206 pounds  Past Medical History:  Diagnosis Date   Anxiety    Asthma    Chicken pox     Current Outpatient Medications  Medication Sig Dispense Refill   albuterol (PROVENTIL HFA;VENTOLIN HFA) 108 (90 Base) MCG/ACT inhaler Inhale 2 puffs into the lungs every 6 (six) hours as needed for wheezing or shortness of breath. 1 Inhaler 0   cetirizine (ZYRTEC) 10 MG tablet TAKE 1 TABLET BY MOUTH EVERY DAY 90 tablet 3   fluticasone (FLONASE) 50 MCG/ACT nasal spray SPRAY 2 SPRAYS INTO EACH NOSTRIL EVERY DAY 48 mL 2   sertraline (ZOLOFT) 50 MG tablet Take 1 tablet (50 mg total) by mouth daily. Further refills PCP 90 tablet 2   No current facility-administered medications for this visit.    Allergies as of 08/31/2021   (No Known Allergies)    ROS:  General:  Negative for anorexia, weight loss, fever, chills, fatigue, weakness. ENT: Negative for hoarseness, difficulty swallowing , nasal congestion. CV: Negative for chest pain, angina, palpitations, dyspnea on exertion, peripheral edema.  Respiratory: Negative for dyspnea at rest, dyspnea on exertion, cough, sputum, wheezing.  GI: See history of present illness. GU:  Negative for dysuria, hematuria, urinary incontinence, urinary frequency, nocturnal urination.  Endo: Negative for unusual weight change.    Physical Examination:   BP 120/80 (BP Location: Right Arm, Patient Position: Sitting, Cuff Size: Large)   Pulse 65   Temp 97.7 F (36.5 C) (Temporal)   Ht 5\' 8"  (1.727 m)   Wt 206 lb (93.4 kg)   BMI 31.32 kg/m   General: Well-nourished, well-developed in no acute distress.  Eyes: No icterus. Conjunctivae pink. Lungs: Clear to auscultation bilaterally. Non-labored. Heart: Regular rate and rhythm, no murmurs rubs or gallops.  Abdomen: Bowel sounds are normal, nontender, nondistended, no hepatosplenomegaly or masses, no abdominal bruits or hernia , no rebound or guarding.   Extremities: No lower extremity edema. No clubbing or deformities. Neuro: Alert and oriented x 3.  Grossly intact. Skin: Warm and dry, no jaundice.   Psych: Alert and cooperative, normal mood and affect.  Labs:    Imaging Studies: No results found.  Assessment and Plan:   Ryan Norton is a 25 y.o. y/o male who comes in with abnormal liver  enzymes that have gone up with his weight gain and his increased triglycerides and LDL.  The patient has been told about the possibility of fatty liver disease but will have his blood work sent off for other possible causes of abnormal liver enzymes.  The patient will also have his ultrasound of his liver reordered to look for fatty liver.  The patient has been explained the plan agrees with it.     Lucilla Lame, MD. Marval Regal    Note: This dictation was prepared with Dragon  dictation along with smaller phrase technology. Any transcriptional errors that result from this process are unintentional.

## 2021-09-02 LAB — IRON AND TIBC
Iron Saturation: 31 % (ref 15–55)
Iron: 119 ug/dL (ref 38–169)
Total Iron Binding Capacity: 378 ug/dL (ref 250–450)
UIBC: 259 ug/dL (ref 111–343)

## 2021-09-02 LAB — FERRITIN: Ferritin: 123 ng/mL (ref 30–400)

## 2021-09-02 LAB — ALPHA-1-ANTITRYPSIN: A-1 Antitrypsin: 142 mg/dL (ref 95–164)

## 2021-09-02 LAB — HEPATIC FUNCTION PANEL
ALT: 73 IU/L — ABNORMAL HIGH (ref 0–44)
AST: 29 IU/L (ref 0–40)
Albumin: 4.6 g/dL (ref 4.1–5.2)
Alkaline Phosphatase: 106 IU/L (ref 44–121)
Bilirubin Total: 0.3 mg/dL (ref 0.0–1.2)
Bilirubin, Direct: <0.1 mg/dL (ref 0.00–0.40)
Total Protein: 7.2 g/dL (ref 6.0–8.5)

## 2021-09-02 LAB — ANTI-SMOOTH MUSCLE ANTIBODY, IGG: Smooth Muscle Ab: 14 Units (ref 0–19)

## 2021-09-02 LAB — HEPATITIS A ANTIBODY, TOTAL: hep A Total Ab: POSITIVE — AB

## 2021-09-02 LAB — CERULOPLASMIN: Ceruloplasmin: 25.5 mg/dL (ref 16.0–31.0)

## 2021-09-02 LAB — MITOCHONDRIAL ANTIBODIES: Mitochondrial Ab: <20 Units (ref 0.0–20.0)

## 2021-09-09 ENCOUNTER — Ambulatory Visit
Admission: RE | Admit: 2021-09-09 | Discharge: 2021-09-09 | Disposition: A | Discharge status: Home / Self Care | Payer: PRIVATE HEALTH INSURANCE | Source: Ambulatory Visit | Attending: Gastroenterology | Admitting: Gastroenterology

## 2021-09-09 ENCOUNTER — Other Ambulatory Visit: Payer: Self-pay

## 2021-09-09 DIAGNOSIS — R748 Abnormal levels of other serum enzymes: Secondary | ICD-10-CM | POA: Diagnosis present

## 2021-09-19 ENCOUNTER — Telehealth: Payer: Self-pay

## 2021-09-19 NOTE — Telephone Encounter (Signed)
Results of the [place] and labs have been sent to the pt through Mychart.

## 2021-09-19 NOTE — Telephone Encounter (Signed)
-----   Message from Midge Minium, MD sent at 09/19/2021 11:09 AM EST ----- Let the patient know that his blood work did not show a cause for his abnormal liver enzymes and his ultrasound did not show a cause either.  At this point with his persistently elevated liver enzymes I would recommend setting the patient up for a liver biopsy.

## 2021-11-09 ENCOUNTER — Encounter: Payer: Self-pay | Admitting: Family Medicine

## 2021-11-11 ENCOUNTER — Ambulatory Visit
Admission: EM | Admit: 2021-11-11 | Discharge: 2021-11-11 | Disposition: A | Discharge status: Home / Self Care | Payer: BC Managed Care – PPO | Attending: Medical Oncology | Admitting: Medical Oncology

## 2021-11-11 ENCOUNTER — Encounter: Payer: Self-pay | Admitting: Emergency Medicine

## 2021-11-11 ENCOUNTER — Other Ambulatory Visit: Payer: Self-pay

## 2021-11-11 DIAGNOSIS — Z20822 Contact with and (suspected) exposure to covid-19: Secondary | ICD-10-CM | POA: Diagnosis not present

## 2021-11-11 DIAGNOSIS — J45909 Unspecified asthma, uncomplicated: Secondary | ICD-10-CM | POA: Diagnosis not present

## 2021-11-11 DIAGNOSIS — J069 Acute upper respiratory infection, unspecified: Secondary | ICD-10-CM | POA: Insufficient documentation

## 2021-11-11 DIAGNOSIS — R0981 Nasal congestion: Secondary | ICD-10-CM | POA: Diagnosis not present

## 2021-11-11 DIAGNOSIS — R5383 Other fatigue: Secondary | ICD-10-CM | POA: Insufficient documentation

## 2021-11-11 LAB — RESP PANEL BY RT-PCR (FLU A&B, COVID) ARPGX2
Influenza A by PCR: NEGATIVE
Influenza B by PCR: NEGATIVE
SARS Coronavirus 2 by RT PCR: NEGATIVE

## 2021-11-11 MED ORDER — PROMETHAZINE-DM 6.25-15 MG/5ML PO SYRP: 5.0000 mL | ORAL_SOLUTION | Freq: Four times a day (QID) | ORAL | 0 refills | Status: DC | PRN

## 2021-11-11 MED ORDER — BENZONATATE 100 MG PO CAPS: 100.0000 mg | ORAL_CAPSULE | Freq: Three times a day (TID) | ORAL | 0 refills | Status: DC

## 2021-11-11 NOTE — ED Provider Notes (Signed)
[facility]    CSN: [id number] Arrival date & time: 11/11/21  1236      History   Chief Complaint Chief Complaint  Patient presents with   Cough    HPI Ryan Norton is a 26 y.o. male.   Patient presents with cough and congestion with green sputum production for 4 days.  Endorses associated increased fatigue and difficulty sleeping mostly related to coughing.  Tolerating food and liquids.  No known sick contacts.  Has not attempted treatment of symptoms.  History of asthma.  Denies fever, chills, body aches, shortness of breath, wheezing, nasal congestion, rhinorrhea, sore throat, abdominal pain, nausea, vomiting, diarrhea, ear pain or headaches.    Past Medical History:  Diagnosis Date   Anxiety    Asthma    Chicken pox     Patient Active Problem List   Diagnosis Date Noted   Obesity (BMI 30.0-34.9) 11/10/2020   Attention deficit 11/10/2020   Anxiety and depression 06/02/2019   Headache 06/02/2019   Vertigo 05/31/2017   Head congestion 01/25/2017   Routine general medical examination at a health care facility 07/11/2016   Preventative health care 08/06/2015   Asthma 08/06/2015    Past Surgical History:  Procedure Laterality Date   no surgical history     WISDOM TOOTH EXTRACTION         Home Medications    Prior to Admission medications   Medication Sig Start Date End Date Taking? Authorizing Provider  cetirizine (ZYRTEC) 10 MG tablet TAKE 1 TABLET BY MOUTH EVERY DAY 05/19/21  Yes Leone Haven, MD  sertraline (ZOLOFT) 50 MG tablet Take 1 tablet (50 mg total) by mouth daily. Further refills PCP 06/10/21  Yes Leone Haven, MD  albuterol (PROVENTIL HFA;VENTOLIN HFA) 108 (90 Base) MCG/ACT inhaler Inhale 2 puffs into the lungs every 6 (six) hours as needed for wheezing or shortness of breath. 08/30/18   Leone Haven, MD  fluticasone Asencion Islam) 50 MCG/ACT nasal spray SPRAY 2 SPRAYS INTO EACH NOSTRIL EVERY DAY 12/17/20   Leone Haven, MD    Family History Family History  Problem Relation Age of Onset   Hyperlipidemia Father    Hypertension Father    Diabetes Paternal Grandmother    Healthy Mother     Social History Social History   Tobacco Use   Smoking status: Never   Smokeless tobacco: Never  Vaping Use   Vaping Use: Never used  Substance Use Topics   Alcohol use: No    Alcohol/week: 0.0 standard drinks   Drug use: No     Allergies   Patient has no known allergies.   Review of Systems Review of Systems  Constitutional:  Negative for activity change, appetite change, chills, diaphoresis, fatigue, fever and unexpected weight change.  HENT:  Negative for congestion, dental problem, drooling, ear discharge, ear pain, facial swelling, hearing loss, mouth sores, nosebleeds, postnasal drip, rhinorrhea, sinus pressure, sinus pain, sneezing, sore throat, tinnitus, trouble swallowing and voice change.   Respiratory:  Positive for cough. Negative for apnea, choking, chest tightness, shortness of breath, wheezing and stridor.   Gastrointestinal: Negative.   Skin: Negative.   Neurological: Negative.     Physical Exam Triage Vital Signs ED Triage Vitals  Enc Vitals Group     BP 11/11/21 1245 (!) 141/95     Pulse Rate 11/11/21 1245 73     Resp 11/11/21 1245 15     Temp 11/11/21 1245 98.5 F (36.9 C)  Temp Source 11/11/21 1245 Oral     SpO2 11/11/21 1245 100 %     Weight 11/11/21 1243 205 lb (93 kg)     Height 11/11/21 1243 5\' 8"  (1.727 m)     Head Circumference --      Peak Flow --      Pain Score 11/11/21 1243 0     Pain Loc --      Pain Edu? --      Excl. in [place]? --    No data found.  Updated Vital Signs BP (!) 141/95 (BP Location: Left Arm)    Pulse 73    Temp 98.5 F (36.9 C) (Oral)    Resp 15    Ht 5\' 8"  (1.727 m)    Wt 205 lb (93 kg)    SpO2 100%    BMI 31.17 kg/m   Visual Acuity Right Eye Distance:   Left Eye Distance:   Bilateral Distance:    Right Eye Near:   Left Eye  Near:    Bilateral Near:     Physical Exam Constitutional:      Appearance: Normal appearance. He is normal weight.  HENT:     Head: Normocephalic.     Right Ear: Tympanic membrane, ear canal and external ear normal.     Left Ear: Tympanic membrane, ear canal and external ear normal.     Nose: Congestion present. No rhinorrhea.     Mouth/Throat:     Mouth: Mucous membranes are moist.     Pharynx: Posterior oropharyngeal erythema present.  Eyes:     Extraocular Movements: Extraocular movements intact.  Cardiovascular:     Rate and Rhythm: Normal rate and regular rhythm.     Pulses: Normal pulses.     Heart sounds: Normal heart sounds.  Pulmonary:     Effort: Pulmonary effort is normal.     Breath sounds: Normal breath sounds.  Musculoskeletal:     Cervical back: Normal range of motion and neck supple.  Skin:    General: Skin is warm and dry.  Neurological:     General: No focal deficit present.     Mental Status: He is alert and oriented to person, place, and time. Mental status is at baseline.  Psychiatric:        Mood and Affect: Mood normal.        Behavior: Behavior normal.     UC Treatments / Results  Labs (all labs ordered are listed, but only abnormal results are displayed) Labs Reviewed  RESP PANEL BY RT-PCR (FLU A&B, COVID) [id number]    EKG   Radiology No results found.  Procedures Procedures (including critical care time)  Medications Ordered in UC Medications - No data to display  Initial Impression / Assessment and Plan / UC Course  I have reviewed the triage vital signs and the nursing notes.  Pertinent labs & imaging results that were available during my care of the patient were reviewed by me and considered in my medical decision making (see chart for details).  Viral URI with cough  Discussed etiology of symptoms with patient, timeline and possible resolution, COVID and flu testing negative, prescribed Tessalon and Promethazine DM to help  manage coughing, suggested over-the-counter melatonin or Benadryl to further aid with sleeping, may use any other over-the-counter medications in addition to help manage symptoms, may follow-up with urgent care with PCP for persistent or worsening symptoms Final Clinical Impressions(s) / UC Diagnoses   Final diagnoses:  None  Discharge Instructions   None    ED Prescriptions   None    PDMP not reviewed this encounter.   Hans Eden, NP 11/11/21 1343

## 2021-11-11 NOTE — ED Triage Notes (Signed)
Patient c/o cough and chest congestion that started on Tuesday.  Patient denies fevers.

## 2021-11-11 NOTE — Discharge Instructions (Addendum)
Your symptoms today are most likely being caused by a virus and should steadily improve in time it can take up to 7 to 10 days before you truly start to see a turnaround however things will get better  COVID and flu testing negative  You may use Tessalon every 8 hours to help calm coughing  You may use Promethazine DM every 6 hours to help calm coughing, be mindful this medication may make you drowsy, if this occurs you may use at bedtime  You may attempt use of over-the-counter melatonin or Benadryl to help you sleep  You may attempt any of the following below in addition if symptoms arise    You can take Tylenol and/or Ibuprofen as needed for fever reduction and pain relief.   For cough: honey 1/2 to 1 teaspoon (you can dilute the honey in water or another fluid).  You can also use guaifenesin. You can use a humidifier for chest congestion and cough.  If you don't have a humidifier, you can sit in the bathroom with the hot shower running.      For sore throat: try warm salt water gargles, cepacol lozenges, throat spray, warm tea or water with lemon/honey, popsicles or ice, or OTC cold relief medicine for throat discomfort.   For congestion: take a daily anti-histamine like Zyrtec, Claritin, and a oral decongestant, such as pseudoephedrine.  You can also use Flonase 1-2 sprays in each nostril daily.   It is important to stay hydrated: drink plenty of fluids (water, gatorade/powerade/pedialyte, juices, or teas) to keep your throat moisturized and help further relieve irritation/discomfort.

## 2021-12-12 ENCOUNTER — Ambulatory Visit: Payer: PRIVATE HEALTH INSURANCE | Admitting: Family Medicine

## 2022-01-09 DIAGNOSIS — M222X1 Patellofemoral disorders, right knee: Secondary | ICD-10-CM | POA: Diagnosis not present

## 2022-01-16 ENCOUNTER — Ambulatory Visit: Payer: Self-pay | Admitting: Family Medicine

## 2022-03-06 ENCOUNTER — Encounter: Payer: Self-pay | Admitting: Nurse Practitioner

## 2022-03-06 ENCOUNTER — Ambulatory Visit: Payer: PRIVATE HEALTH INSURANCE | Admitting: Family Medicine

## 2022-03-06 ENCOUNTER — Encounter: Payer: Self-pay | Admitting: Family Medicine

## 2022-03-06 ENCOUNTER — Ambulatory Visit (INDEPENDENT_AMBULATORY_CARE_PROVIDER_SITE_OTHER): Payer: BC Managed Care – PPO | Admitting: Nurse Practitioner

## 2022-03-06 VITALS — BP 120/84 | HR 62 | Temp 97.5°F | Resp 16 | Ht 68.11 in | Wt 211.4 lb

## 2022-03-06 DIAGNOSIS — R0981 Nasal congestion: Secondary | ICD-10-CM | POA: Diagnosis not present

## 2022-03-06 MED ORDER — METHYLPREDNISOLONE ACETATE 80 MG/ML IJ SUSP
80.0000 mg | Freq: Once | INTRAMUSCULAR | Status: AC
  Administered 2022-03-06: 80 mg via INTRAMUSCULAR

## 2022-03-06 NOTE — Assessment & Plan Note (Signed)
Patient tried over-the-counter treatment without great relief.  Given the time no need to test for COVID at this point.  Patient continue using over-the-counter antihistamines and Flonase as beneficial.  Did discuss strict precautions in regards to oxymetazoline nasal spray.  We will administer Depo-Medrol 80 mg IM times once in office today.  Did encourage patient to look into sinus nasal rinse wash for symptom management. ?

## 2022-03-06 NOTE — Patient Instructions (Signed)
Nice to see you today ?We did give you a steroid injection in office to help with the congestion ?Afrin is an over the counter decongestant. If you use it follow the directions on the bottle exactly ?Follow up if no improvement ?Ok to continue the antihistamine and Flonase ?Can consider an over the counter sinus rinse kit if continued problems ?

## 2022-03-06 NOTE — Progress Notes (Signed)
? ?Acute Office Visit ? ?Subjective:  ? ?  ?Patient ID: Ryan Norton, male    DOB: September 12, 1996, 26 y.o.   MRN: [id number] ? ?Chief Complaint  ?Patient presents with  ? Nasal Congestion  ?  X 2 weeks. Takes Zyrtec, tried Human resources officer for a few days, Nyquil/Dayquil, Benadryl-nothing helped. Tried Flonase a little.   ? ? ?HPI ?Patient is in today for nasal congestion  ? ?Symptoms started approx 2 weeks ago. States that he felt that pollen was higher and then the nasal congestion happened. States that it continued to increase. He would blow his nose and sometimes get stuff out and stometimes it would ? ?He has tried dyquill/nyquill, zyrtec, allegra and bendaryl. States that he has tried flonase without relife.  ?Thinks that he has seen an ear nose and throat in the  past and a normal amount of sinus infection  ? ? ? ?Review of Systems  ?Constitutional:  Negative for chills, fever and malaise/fatigue.  ?HENT:  Positive for congestion, ear pain (stuffy feeling) and sinus pain (pressure). Negative for ear discharge and sore throat.   ?Respiratory:  Negative for cough and shortness of breath.   ?Neurological:  Negative for headaches.  ? ? ?   ?Objective:  ?  ?BP 120/84   Pulse 62   Temp (!) 97.5 ?F (36.4 ?C)   Resp 16   Ht 5' 8.11" (1.73 m)   Wt 211 lb 6 oz (95.9 kg)   SpO2 96%   BMI 32.04 kg/m?  ? ? ?Physical Exam ?Vitals and nursing note reviewed.  ?Constitutional:   ?   Appearance: Normal appearance.  ?HENT:  ?   Right Ear: Tympanic membrane, ear canal and external ear normal.  ?   Left Ear: Tympanic membrane, ear canal and external ear normal.  ?   Nose: Congestion present.  ?   Right Nostril: No septal hematoma or occlusion.  ?   Left Nostril: No septal hematoma or occlusion.  ?   Right Sinus: No maxillary sinus tenderness or frontal sinus tenderness.  ?   Left Sinus: No maxillary sinus tenderness or frontal sinus tenderness.  ?   Mouth/Throat:  ?   Mouth: Mucous membranes are moist.  ?   Pharynx: Oropharynx is  clear. No posterior oropharyngeal erythema.  ?Cardiovascular:  ?   Rate and Rhythm: Normal rate and regular rhythm.  ?   Heart sounds: Normal heart sounds.  ?Pulmonary:  ?   Effort: Pulmonary effort is normal.  ?   Breath sounds: Normal breath sounds.  ?Lymphadenopathy:  ?   Cervical: No cervical adenopathy.  ?Neurological:  ?   Mental Status: He is alert.  ? ? ?No results found for any visits on 03/06/22. ? ? ?   ?Assessment & Plan:  ? ?Problem List Items Addressed This Visit   ? ?  ? Other  ? Nasal congestion - Primary  ?  Patient tried over-the-counter treatment without great relief.  Given the time no need to test for COVID at this point.  Patient continue using over-the-counter antihistamines and Flonase as beneficial.  Did discuss strict precautions in regards to oxymetazoline nasal spray.  We will administer Depo-Medrol 80 mg IM times once in office today.  Did encourage patient to look into sinus nasal rinse wash for symptom management. ? ?  ?  ? ? ?Meds ordered this encounter  ?Medications  ? methylPREDNISolone acetate (DEPO-MEDROL) injection 80 mg  ? ? ?Return if symptoms worsen or fail to  improve. ? ?Romilda Garret, NP ? ? ?

## 2022-05-30 ENCOUNTER — Other Ambulatory Visit: Payer: Self-pay | Admitting: Family Medicine

## 2022-06-05 ENCOUNTER — Telehealth (INDEPENDENT_AMBULATORY_CARE_PROVIDER_SITE_OTHER): Payer: BC Managed Care – PPO | Admitting: Family Medicine

## 2022-06-05 ENCOUNTER — Encounter: Payer: Self-pay | Admitting: Family Medicine

## 2022-06-05 DIAGNOSIS — J0140 Acute pansinusitis, unspecified: Secondary | ICD-10-CM | POA: Diagnosis not present

## 2022-06-05 MED ORDER — AMOXICILLIN-POT CLAVULANATE 875-125 MG PO TABS: 1.0000 | ORAL_TABLET | Freq: Two times a day (BID) | ORAL | 0 refills | Status: DC

## 2022-06-05 NOTE — Progress Notes (Signed)
Virtual Video Visit via MyChart Note  I connected with  Ryan Norton on 06/05/22 at  3:00 PM EDT by the video enabled telemedicine application for MyChart, and verified that I am speaking with the correct person using two identifiers.   I introduced myself as a Publishing rights manager with the practice. We discussed the limitations of evaluation and management by telemedicine and the availability of in person appointments. The patient expressed understanding and agreed to proceed.  Participating parties in this visit include: The patient and the nurse practitioner listed.  The patient is: in the car I am: In the office - [facility] at [facility]  Subjective:    CC: nasal congestion, sinus pressure  HPI: Ryan Norton is a 26 y.o. year old male presenting today via MyChart today for nasal congestion and sinus pressure.  Patient reporting symptoms for at least the past week and a half. States he has had nasal congestion, post nasal drainage, occasional sore throat, ear pressure, headache, sinus pain (ethmoid, frontal, maxillary). He has not been getting relief with Zyrtec and Flonase. Just started Mucinex Sinus within the past day or two and that is making minimal improvement. He denies any cough, chest pain, dyspnea, wheezing, fevers, chills, body aches. No known sick contacts.    Past medical history, Surgical history, Family history not pertinant except as noted below, Social history, Allergies, and medications have been entered into the medical record, reviewed, and corrections made.   Review of Systems:  All review of systems negative except what is listed in the HPI   Objective:    General:  Speaking clearly in complete sentences. Absent shortness of breath noted.   Alert and oriented x3.   Normal judgment.  Absent acute distress.   Impression and Recommendations:    1. Acute non-recurrent pansinusitis - amoxicillin-clavulanate (AUGMENTIN) 875-125 MG  tablet; Take 1 tablet by mouth 2 (two) times daily.  Dispense: 20 tablet; Refill: 0  Given duration, treating as bacterial infection with Augmentin. Continue Zyrtec, Flonase, Mucinex. Continue supportive measures including rest, hydration, humidifier use, steam showers, warm compresses to sinuses, warm liquids with lemon and honey, and over-the-counter cough, cold, and analgesics as needed.   Patient aware of signs/symptoms requiring further/urgent evaluation.    Follow-up if symptoms worsen or fail to improve.    I discussed the assessment and treatment plan with the patient. The patient was provided an opportunity to ask questions and all were answered. The patient agreed with the plan and demonstrated an understanding of the instructions.   The patient was advised to call back or seek an in-person evaluation if the symptoms worsen or if the condition fails to improve as anticipated.    Clayborne Dana, NP

## 2022-06-06 ENCOUNTER — Encounter: Payer: Self-pay | Admitting: Family Medicine

## 2022-06-08 ENCOUNTER — Ambulatory Visit
Admission: EM | Admit: 2022-06-08 | Discharge: 2022-06-08 | Disposition: A | Discharge status: Home / Self Care | Payer: BC Managed Care – PPO | Attending: Emergency Medicine | Admitting: Emergency Medicine

## 2022-06-08 ENCOUNTER — Telehealth: Payer: Self-pay | Admitting: Family Medicine

## 2022-06-08 ENCOUNTER — Telehealth: Payer: Self-pay

## 2022-06-08 DIAGNOSIS — G479 Sleep disorder, unspecified: Secondary | ICD-10-CM | POA: Diagnosis not present

## 2022-06-08 DIAGNOSIS — R0981 Nasal congestion: Secondary | ICD-10-CM

## 2022-06-08 MED ORDER — IPRATROPIUM BROMIDE 0.06 % NA SOLN: 2.0000 | Freq: Four times a day (QID) | NASAL | 12 refills | Status: DC

## 2022-06-08 MED ORDER — PROMETHAZINE-DM 6.25-15 MG/5ML PO SYRP: 5.0000 mL | ORAL_SOLUTION | Freq: Four times a day (QID) | ORAL | 0 refills | Status: DC | PRN

## 2022-06-08 NOTE — Telephone Encounter (Signed)
Pt called in stating he is taking a new medication and does not know the name of it but the mg is 875. Pt stated he had a panic attack does not know if is connected or not to the medication. Sent to access nurse

## 2022-06-08 NOTE — ED Triage Notes (Signed)
Patient reports that he has had a lot of sinus congestion for about 2 weeks now -- patient was given an antibiotic for the sinus pressure.   Past 2 nights patient reports only  -- 30 mins of sleep   This AM @ about 3 started having a panic attack.

## 2022-06-08 NOTE — ED Provider Notes (Signed)
[facility]    CSN: [id number] Arrival date & time: 06/08/22  1431      History   Chief Complaint Chief Complaint  Patient presents with   Nasal Congestion   Panic Attack   Trouble Sleeping    HPI Ryan Norton is a 26 y.o. male.   HPI  26 year old male here for evaluation of respiratory and sleep complaints.  Patient reports that for the last 2 weeks he has been experiencing severe nasal congestion and initially had a sore throat and ear pain but those have all resolved.  He had a telemetry health visit with his PCP 4 days ago and was prescribed Augmentin for possible sinusitis.  He states that Tuesday Wednesday of this week he was not able to sleep and then last night at around 3 AM he woke up and had a panic attack.  He has had panic attacks in the past but they are infrequent.  In addition to the Augmentin patient states that he is also been using over-the-counter Mucinex sinus max for his symptoms.  He states that he typically goes to bed around 10 PM and he takes his last dose at around 3 PM.  Past Medical History:  Diagnosis Date   Anxiety    Asthma    Chicken pox     Patient Active Problem List   Diagnosis Date Noted   Obesity (BMI 30.0-34.9) 11/10/2020   Attention deficit 11/10/2020   Anxiety and depression 06/02/2019   Headache 06/02/2019   Vertigo 05/31/2017   Nasal congestion 01/25/2017   Routine general medical examination at a health care facility 07/11/2016   Preventative health care 08/06/2015   Asthma 08/06/2015    Past Surgical History:  Procedure Laterality Date   no surgical history     WISDOM TOOTH EXTRACTION         Home Medications    Prior to Admission medications   Medication Sig Start Date End Date Taking? Authorizing Provider  amoxicillin-clavulanate (AUGMENTIN) 875-125 MG tablet Take 1 tablet by mouth 2 (two) times daily. 06/05/22  Yes Clayborne Dana, NP  cetirizine (ZYRTEC) 10 MG tablet TAKE 1 TABLET BY MOUTH  EVERY DAY 05/19/21  Yes Glori Luis, MD  ipratropium (ATROVENT) 0.06 % nasal spray Place 2 sprays into both nostrils 4 (four) times daily. 06/08/22  Yes Becky Augusta, NP  promethazine-dextromethorphan (PROMETHAZINE-DM) 6.25-15 MG/5ML syrup Take 5 mLs by mouth 4 (four) times daily as needed. 06/08/22  Yes Becky Augusta, NP  sertraline (ZOLOFT) 50 MG tablet TAKE 1 TABLET (50 MG TOTAL) BY MOUTH DAILY. FURTHER REFILLS PCP 05/30/22  Yes Worthy Rancher B, FNP  albuterol (PROVENTIL HFA;VENTOLIN HFA) 108 (90 Base) MCG/ACT inhaler Inhale 2 puffs into the lungs every 6 (six) hours as needed for wheezing or shortness of breath. 08/30/18   Glori Luis, MD  fluticasone Aleda Grana) 50 MCG/ACT nasal spray SPRAY 2 SPRAYS INTO EACH NOSTRIL EVERY DAY 12/17/20   Glori Luis, MD    Family History Family History  Problem Relation Age of Onset   Hyperlipidemia Father    Hypertension Father    Diabetes Paternal Grandmother    Healthy Mother     Social History Social History   Tobacco Use   Smoking status: Never   Smokeless tobacco: Never  Vaping Use   Vaping Use: Never used  Substance Use Topics   Alcohol use: No    Alcohol/week: 0.0 standard drinks of alcohol   Drug use: No  Allergies   Patient has no known allergies.   Review of Systems Review of Systems  Constitutional:  Negative for fever.  HENT:  Positive for congestion and sinus pressure. Negative for ear pain and sore throat.   Respiratory:  Negative for cough.   Psychiatric/Behavioral:  Positive for sleep disturbance.      Physical Exam Triage Vital Signs ED Triage Vitals  Enc Vitals Group     BP 06/08/22 1441 138/74     Pulse Rate 06/08/22 1441 62     Resp --      Temp 06/08/22 1441 97.7 F (36.5 C)     Temp src --      SpO2 06/08/22 1441 97 %     Weight 06/08/22 1439 210 lb (95.3 kg)     Height 06/08/22 1439 5\' 7"  (1.702 m)     Head Circumference --      Peak Flow --      Pain Score 06/08/22 1439 0      Pain Loc --      Pain Edu? --      Excl. in GC? --    No data found.  Updated Vital Signs BP 138/74 (BP Location: Left Arm)   Pulse 62   Temp 97.7 F (36.5 C)   Ht 5\' 7"  (1.702 m)   Wt 210 lb (95.3 kg)   SpO2 97%   BMI 32.89 kg/m   Visual Acuity Right Eye Distance:   Left Eye Distance:   Bilateral Distance:    Right Eye Near:   Left Eye Near:    Bilateral Near:     Physical Exam Vitals and nursing note reviewed.  Constitutional:      Appearance: Normal appearance. He is not ill-appearing.  HENT:     Head: Normocephalic and atraumatic.     Right Ear: Tympanic membrane, ear canal and external ear normal. There is no impacted cerumen.     Left Ear: Tympanic membrane, ear canal and external ear normal. There is no impacted cerumen.     Nose: Congestion and rhinorrhea present.     Mouth/Throat:     Mouth: Mucous membranes are moist.     Pharynx: Oropharynx is clear. No oropharyngeal exudate or posterior oropharyngeal erythema.  Cardiovascular:     Rate and Rhythm: Normal rate and regular rhythm.     Pulses: Normal pulses.     Heart sounds: Normal heart sounds. No murmur heard.    No friction rub. No gallop.  Pulmonary:     Effort: Pulmonary effort is normal.     Breath sounds: Normal breath sounds. No wheezing, rhonchi or rales.  Musculoskeletal:     Cervical back: Normal range of motion and neck supple.  Lymphadenopathy:     Cervical: No cervical adenopathy.  Skin:    General: Skin is warm and dry.     Capillary Refill: Capillary refill takes less than 2 seconds.     Findings: No erythema or rash.  Neurological:     General: No focal deficit present.     Mental Status: He is alert and oriented to person, place, and time.  Psychiatric:        Mood and Affect: Mood normal.        Behavior: Behavior normal.        Thought Content: Thought content normal.        Judgment: Judgment normal.      UC Treatments / Results  Labs (all labs ordered are listed,  but  only abnormal results are displayed) Labs Reviewed - No data to display  EKG   Radiology No results found.  Procedures Procedures (including critical care time)  Medications Ordered in UC Medications - No data to display  Initial Impression / Assessment and Plan / UC Course  I have reviewed the triage vital signs and the nursing notes.  Pertinent labs & imaging results that were available during my care of the patient were reviewed by me and considered in my medical decision making (see chart for details).   Patient is a pleasant, nontoxic-appearing 13 old male here for evaluation of ongoing sinus pressure and sleep disturbance as outlined in HPI above.  Patient contacted his PCP because he was wondering if the Augmentin might have caused him to have have no sleep the previous 2 nights since he began taking it.  In addition to the Augmentin he is also taken Mucinex sinus max which contains phenylephrine.  In addition to getting no sleep for the previous 2 nights he woke this morning around 3 AM with a panic attack when she was sobbing, crying, and shaking.  He states that those symptoms have resolved now.  He has had panic attacks in the past but they are infrequent.  Patient's physical exam reveals pearly-gray tympanic membranes bilaterally with normal light reflex and clear external auditory canals.  Nasal mucosa is erythematous and edematous without any discharge noted in either nare.  Patient does have tenderness to percussion of both frontal and maxillary sinuses.  Oropharyngeal exam is benign.  No cervical lymphadenopathy appreciable exam.  Cardiopulmonary exam reveals clear lung sounds in all fields and heart sounds that are S1-S2 with regular rate and rhythm.  Patient does have congestion and sinus tenderness so I have encouraged him to finish up his Augmentin but I do not believe this is what is leading to his sleep difficulty.  I am concerned that the phenylephrine and the Mucinex max  is what is causing him to have a sleep deficit and I have encouraged him to stop taking this medication.  I will give him some Atrovent nasal spray that he can use to help with nasal congestion as well as some Promethazine DM cough syrup that he can use at bedtime to help with congestion and sleep.  I have advised him that if his symptoms continue he should follow-up with his PCP or return for reevaluation.   Final Clinical Impressions(s) / UC Diagnoses   Final diagnoses:  Nasal congestion  Sleep disturbance     Discharge Instructions      Finish your Augmentin for your sinus infection.  Stop taking the Mucinex sinus maxx as I believe the phenylephrine is what is impairing your sleep.  Use the Atrovent nasal spray, 2 squirts in each nostril every 6 hours as needed for congestion.  Take the Promethazine DM at bedtime to help with congestion and this will also help you sleep.  Follow-up with your PCP for continued symptoms.     ED Prescriptions     Medication Sig Dispense Auth. Provider   ipratropium (ATROVENT) 0.06 % nasal spray Place 2 sprays into both nostrils 4 (four) times daily. 15 mL Becky Augusta, NP   promethazine-dextromethorphan (PROMETHAZINE-DM) 6.25-15 MG/5ML syrup Take 5 mLs by mouth 4 (four) times daily as needed. 118 mL Becky Augusta, NP      PDMP not reviewed this encounter.   Becky Augusta, NP 06/08/22 1537

## 2022-06-08 NOTE — Telephone Encounter (Signed)
LMOM for pt to CB to get sched

## 2022-06-08 NOTE — Discharge Instructions (Signed)
Finish your Augmentin for your sinus infection.  Stop taking the Mucinex sinus maxx as I believe the phenylephrine is what is impairing your sleep.  Use the Atrovent nasal spray, 2 squirts in each nostril every 6 hours as needed for congestion.  Take the Promethazine DM at bedtime to help with congestion and this will also help you sleep.  Follow-up with your PCP for continued symptoms.

## 2022-06-08 NOTE — Telephone Encounter (Signed)
LMOM to CB in regards to hsi access nurse trying to get him scheduled   Barb Merino routed conversation to Allean Found, CMA 3 hours ago (12:15 PM)   Alphonzo Dublin D 3 hours ago (12:15 PM)   AD Pt called in stating he is taking a new medication and does not know the name of it but the mg is 875. Pt stated he had a panic attack does not know if is connected or not to the medication. Sent to access nurse        Note    [place], Vilma Meckel

## 2022-06-09 ENCOUNTER — Telehealth: Payer: Self-pay

## 2022-06-09 NOTE — Telephone Encounter (Signed)
Pt was sent to access nurse on yetserday about his panic attacks with an unknown medication. They suggested he see his PCP in 3 days, on yesterday he was admitted to the ED for panic attacks as well

## 2022-06-12 ENCOUNTER — Telehealth: Payer: Self-pay

## 2022-06-12 NOTE — Telephone Encounter (Signed)
LMOM for pt to CB to get scheduled for a hospital f/u with his PCP    Eulis Foster, FNP  You; Glori Luis, MD 3 days ago    Schedule ER follow-up with PCP     You  Glori Luis, MD 3 days ago    Pt was admitted to the ED on yesterday in regards to his panic attacks , nasal congestion, and trouble sleeping.     You 4 days ago    [facility] for pt to CB to get sched      Note     Barb Merino routed conversation to Allean Found, CMA 4 days ago   Alphonzo Dublin D 4 days ago   AD Pt called in stating he is taking a new medication and does not know the name of it but the mg is 875. Pt stated he had a panic attack does not know if is connected or not to the medication. Sent to access nurse        Note    [place], Vilma Meckel

## 2022-06-30 ENCOUNTER — Ambulatory Visit (INDEPENDENT_AMBULATORY_CARE_PROVIDER_SITE_OTHER): Payer: BC Managed Care – PPO | Admitting: Family

## 2022-06-30 ENCOUNTER — Ambulatory Visit (INDEPENDENT_AMBULATORY_CARE_PROVIDER_SITE_OTHER): Payer: BC Managed Care – PPO

## 2022-06-30 ENCOUNTER — Encounter: Payer: Self-pay | Admitting: Family

## 2022-06-30 VITALS — BP 110/74 | HR 66 | Temp 97.6°F | Ht 68.0 in | Wt 213.1 lb

## 2022-06-30 DIAGNOSIS — M545 Low back pain, unspecified: Secondary | ICD-10-CM

## 2022-06-30 DIAGNOSIS — G4700 Insomnia, unspecified: Secondary | ICD-10-CM | POA: Diagnosis not present

## 2022-06-30 DIAGNOSIS — J3089 Other allergic rhinitis: Secondary | ICD-10-CM

## 2022-06-30 MED ORDER — TRAZODONE HCL 50 MG PO TABS: 25.0000 mg | ORAL_TABLET | Freq: Every evening | ORAL | 3 refills | Status: DC | PRN

## 2022-06-30 MED ORDER — MONTELUKAST SODIUM 10 MG PO TABS: 10.0000 mg | ORAL_TABLET | Freq: Every day | ORAL | 2 refills | Status: DC

## 2022-06-30 NOTE — Assessment & Plan Note (Signed)
Chronic.  Benign HEENT exam today.  Discussed somewhat recurrent nature of sinusitis.  Discussed of structural evaluation needed with ENT.  He should also consider formal allergy testing, OSA.  We opted to start Singulair.  Counseled on blackbox warning as a relates to unusual thoughts.  Close follow-up.

## 2022-06-30 NOTE — Assessment & Plan Note (Signed)
Chronic.  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  Pending x-rays and trial of physical therapy.  Counseled on importance of heat and ice.

## 2022-06-30 NOTE — Patient Instructions (Addendum)
Start trazodone first.  If you tolerate this medication well without side effects then you may start Singulair as discussed    As discussed, Singulair  carries a black box warning as it relates to any unusual thoughts, suicidal ideation.   Referral to ENT, and physical therapy  Let us know if you dont hear back within a week in regards to an appointment being scheduled.   Sacroiliac Joint Dysfunction  Sacroiliac joint dysfunction is a condition that causes inflammation on one or both sides of the sacroiliac (SI) joint. The SI joint is the joint between two bones of the pelvis called the sacrum and the ilium. The sacrum is the bone at the base of the spine. The ilium is the large bone that forms the hip. This condition causes deep aching or burning pain in the low back. In some cases, the pain may also spread into one or both buttocks, hips, or thighs. What are the causes? This condition may be caused by: Pregnancy. During pregnancy, extra stress is put on the SI joints because the pelvis widens. Injury, such as: Injuries from car crashes. Sports-related injuries. Work-related injuries. Having one leg that is shorter than the other. Conditions that affect the joints, such as: Rheumatoid arthritis. Gout. Psoriatic arthritis. Joint infection (septic arthritis). Sometimes, the cause of SI joint dysfunction is not known. What are the signs or symptoms? Symptoms of this condition include: Aching or burning pain in the lower back. The pain may also spread to other areas, such as: Buttocks. Groin. Thighs. Muscle spasms in or around the painful areas. Increased pain when standing, walking, running, stair climbing, bending, or lifting. How is this diagnosed? This condition is diagnosed with a physical exam and your medical history. During the exam, the health care provider may move one or both of your legs to different positions to check for pain. Various tests may be done to confirm the  diagnosis, including: Imaging tests to look for other causes of pain. These may include: MRI. CT scan. Bone scan. Diagnostic injection. A numbing medicine is injected into the SI joint using a needle. If your pain is temporarily improved or stopped after the injection, this can indicate that SI joint dysfunction is the problem. How is this treated? Treatment depends on the cause and severity of your condition. Treatment options can be noninvasive and may include: Ice or heat applied to the lower back area after an injury. This may help reduce pain and muscle spasms. Medicines to relieve pain or inflammation or to relax the muscles. Wearing a back brace (sacroiliac brace) to help support the joint while your back is healing. Physical therapy to increase muscle strength around the joint and flexibility at the joint. This may also involve learning proper body positions and ways of moving to relieve stress on the joint. Direct manipulation of the SI joint. Use of a device that provides electrical stimulation to help reduce pain at the joint. Other treatments may include: Injections of steroid medicine into the joint to reduce pain and swelling. Radiofrequency ablation. This treatment uses heat to burn away nerves that are carrying pain messages from the joint. Surgery to put in screws and plates that limit or prevent joint motion. This is rare. Follow these instructions at home: Medicines Take over-the-counter and prescription medicines only as told by your health care provider. Ask your health care provider if the medicine prescribed to you: Requires you to avoid driving or using machinery. Can cause constipation. You may need to  take these actions to prevent or treat constipation: Drink enough fluid to keep your urine pale yellow. Take over-the-counter or prescription medicines. Eat foods that are high in fiber, such as beans, whole grains, and fresh fruits and vegetables. Limit foods that  are high in fat and processed sugars, such as fried or sweet foods. If you have a brace: Wear the brace as told by your health care provider. Remove it only as told by your health care provider. Keep the brace clean. If the brace is not waterproof: Do not let it get wet. Cover it with a watertight covering when you take a bath or a shower. Managing pain, stiffness, and swelling     Icing can help with pain and swelling. Heat may help with muscle tension or spasms. Ask your health care provider if you should use ice or heat. If directed, put ice on the affected area: If you have a removable brace, remove it as told by your health care provider. Put ice in a plastic bag. Place a towel between your skin and the bag. Leave the ice on for 20 minutes, 2-3 times a day. Remove the ice if your skin turns bright red. This is very important. If you cannot feel pain, heat, or cold, you have a greater risk of damage to the area. If directed, apply heat to the affected area as often as told by your health care provider. Use the heat source that your health care provider recommends, such as a moist heat pack or a heating pad. Place a towel between your skin and the heat source. Leave the heat on for 20-30 minutes. Remove the heat if your skin turns bright red. This is especially important if you are unable to feel pain, heat, or cold. You may have a greater risk of getting burned. General instructions Rest as needed. Return to your normal activities as told by your health care provider. Ask your health care provider what activities are safe for you. Do exercises as told by your health care provider or physical therapist. Keep all follow-up visits. This is important. Contact a health care provider if: Your pain is not controlled with medicine. You have a fever. Your pain is getting worse. Get help right away if: You have weakness, numbness, or tingling in your legs or feet. You lose control of your  bladder or bowels. Summary Sacroiliac (SI) joint dysfunction is a condition that causes inflammation on one or both sides of the SI joint. This condition causes deep aching or burning pain in the low back. In some cases, the pain may also spread into one or both buttocks, hips, or thighs. Treatment depends on the cause and severity of your condition. It may include medicines to reduce pain and swelling or to relax muscles. This information is not intended to replace advice given to you by your health care provider. Make sure you discuss any questions you have with your health care provider. Document Revised: 02/19/2020 Document Reviewed: 02/19/2020 Elsevier Patient Education  2023 [employer].

## 2022-06-30 NOTE — Assessment & Plan Note (Signed)
Anxiety depression appear well controlled.  Patient politely declines increasing Zoloft to see if this helps with sleep.  We opted to start trazodone.  Advised him not to start trazodone and Singulair at the same time but to stagger these medications to ensure no side effects.  Close follow-up
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
## 2022-07-18 ENCOUNTER — Encounter: Payer: Self-pay | Admitting: Emergency Medicine

## 2022-07-18 ENCOUNTER — Ambulatory Visit
Admission: EM | Admit: 2022-07-18 | Discharge: 2022-07-18 | Disposition: A | Discharge status: Home / Self Care | Payer: BC Managed Care – PPO | Attending: Physician Assistant | Admitting: Physician Assistant

## 2022-07-18 DIAGNOSIS — J029 Acute pharyngitis, unspecified: Secondary | ICD-10-CM | POA: Diagnosis not present

## 2022-07-18 DIAGNOSIS — J069 Acute upper respiratory infection, unspecified: Secondary | ICD-10-CM | POA: Diagnosis not present

## 2022-07-18 DIAGNOSIS — Z1152 Encounter for screening for COVID-19: Secondary | ICD-10-CM | POA: Diagnosis not present

## 2022-07-18 DIAGNOSIS — R051 Acute cough: Secondary | ICD-10-CM | POA: Diagnosis not present

## 2022-07-18 LAB — GROUP A STREP BY PCR: Group A Strep by PCR: NOT DETECTED

## 2022-07-18 NOTE — ED Triage Notes (Signed)
Pt c/o cough, nasal congestion, sore throat. Started about 4 day ago. Denies fever or body aches.

## 2022-07-18 NOTE — ED Provider Notes (Signed)
[facility]    CSN: [id number] Arrival date & time: 07/18/22  1015      History   Chief Complaint Chief Complaint  Patient presents with   Cough   Nasal Congestion    HPI Ryan Norton is a 26 y.o. male presenting for 4-day history of cough, congestion, sore throat.  Denies fever, fatigue, body aches, breathing difficulty.  No sick contacts.  History of asthma but has not needed to use inhalers.  Taking OTC decongestants.  Patient denies any worsening of symptoms but has not started to have any improvements yet.  No other complaints.  HPI  Past Medical History:  Diagnosis Date   Anxiety    Asthma    Chicken pox     Patient Active Problem List   Diagnosis Date Noted   Insomnia 06/30/2022   Low back pain 06/30/2022   Obesity (BMI 30.0-34.9) 11/10/2020   Attention deficit 11/10/2020   Anxiety and depression 06/02/2019   Headache 06/02/2019   Vertigo 05/31/2017   Allergic rhinitis 01/25/2017   Routine general medical examination at a health care facility 07/11/2016   Preventative health care 08/06/2015   Asthma 08/06/2015    Past Surgical History:  Procedure Laterality Date   no surgical history     WISDOM TOOTH EXTRACTION         Home Medications    Prior to Admission medications   Medication Sig Start Date End Date Taking? Authorizing Provider  cetirizine (ZYRTEC) 10 MG tablet TAKE 1 TABLET BY MOUTH EVERY DAY 05/19/21  Yes Glori Luis, MD  montelukast (SINGULAIR) 10 MG tablet Take 1 tablet (10 mg total) by mouth at bedtime. 06/30/22  Yes Arnett, Lyn Records, FNP  sertraline (ZOLOFT) 50 MG tablet TAKE 1 TABLET (50 MG TOTAL) BY MOUTH DAILY. FURTHER REFILLS PCP 05/30/22  Yes Eulis Foster, FNP  traZODone (DESYREL) 50 MG tablet Take 0.5-1 tablets (25-50 mg total) by mouth at bedtime as needed for sleep. 06/30/22  Yes Arnett, Lyn Records, FNP  albuterol (PROVENTIL HFA;VENTOLIN HFA) 108 (90 Base) MCG/ACT inhaler Inhale 2 puffs into the lungs every  6 (six) hours as needed for wheezing or shortness of breath. 08/30/18   Glori Luis, MD  ipratropium (ATROVENT) 0.06 % nasal spray Place 2 sprays into both nostrils 4 (four) times daily. 06/08/22   Becky Augusta, NP    Family History Family History  Problem Relation Age of Onset   Hyperlipidemia Father    Hypertension Father    Diabetes Paternal Grandmother    Healthy Mother     Social History Social History   Tobacco Use   Smoking status: Never   Smokeless tobacco: Never  Vaping Use   Vaping Use: Never used  Substance Use Topics   Alcohol use: No    Alcohol/week: 0.0 standard drinks of alcohol   Drug use: No     Allergies   Patient has no known allergies.   Review of Systems Review of Systems  Constitutional:  Negative for fatigue and fever.  HENT:  Positive for congestion, rhinorrhea and sore throat. Negative for sinus pressure and sinus pain.   Respiratory:  Positive for cough. Negative for shortness of breath.   Gastrointestinal:  Negative for abdominal pain, diarrhea, nausea and vomiting.  Musculoskeletal:  Negative for myalgias.  Neurological:  Negative for weakness, light-headedness and headaches.  Hematological:  Negative for adenopathy.     Physical Exam Triage Vital Signs ED Triage Vitals  Enc Vitals Group  BP      Pulse      Resp      Temp      Temp src      SpO2      Weight      Height      Head Circumference      Peak Flow      Pain Score      Pain Loc      Pain Edu?      Excl. in [place]?    No data found.  Updated Vital Signs BP (!) 140/87 (BP Location: Left Arm)   Pulse 82   Temp 97.9 F (36.6 C) (Oral)   Resp 16   Ht 5\' 8"  (1.727 m)   Wt 213 lb 3 oz (96.7 kg)   SpO2 99%   BMI 32.41 kg/m      Physical Exam Vitals and nursing note reviewed.  Constitutional:      General: He is not in acute distress.    Appearance: Normal appearance. He is well-developed. He is not ill-appearing.  HENT:     Head: Normocephalic and  atraumatic.     Nose: Congestion present.     Mouth/Throat:     Mouth: Mucous membranes are moist.     Pharynx: Oropharynx is clear. Posterior oropharyngeal erythema present.  Eyes:     General: No scleral icterus.    Conjunctiva/sclera: Conjunctivae normal.  Cardiovascular:     Rate and Rhythm: Normal rate and regular rhythm.     Heart sounds: Normal heart sounds.  Pulmonary:     Effort: Pulmonary effort is normal. No respiratory distress.     Breath sounds: Normal breath sounds.  Abdominal:     Palpations: Abdomen is soft.     Tenderness: There is no abdominal tenderness.  Musculoskeletal:     Cervical back: Neck supple.  Skin:    General: Skin is warm and dry.     Capillary Refill: Capillary refill takes less than 2 seconds.  Neurological:     General: No focal deficit present.     Mental Status: He is alert. Mental status is at baseline.     Motor: No weakness.     Gait: Gait normal.  Psychiatric:        Mood and Affect: Mood normal.        Behavior: Behavior normal.      UC Treatments / Results  Labs (all labs ordered are listed, but only abnormal results are displayed) Labs Reviewed  SARS CORONAVIRUS 2 (TAT 6-24 HRS)  GROUP A STREP BY PCR    EKG   Radiology No results found.  Procedures Procedures (including critical care time)  Medications Ordered in UC Medications - No data to display  Initial Impression / Assessment and Plan / UC Course  I have reviewed the triage vital signs and the nursing notes.  Pertinent labs & imaging results that were available during my care of the patient were reviewed by me and considered in my medical decision making (see chart for details).   26 year old male presenting for 4-day history of cough, congestion and sore throat.  No fever or breathing difficulty.  No sick contacts.  Vitals are stable.  He is overall well-appearing.  On exam he does have nasal congestion and erythema posterior pharynx.  Chest clear to  auscultation heart regular rate and rhythm.  Patient strep test and COVID testing obtained.  Advised patient we will contact him with any positive results.  Advised him symptoms most consistent with viral illness.  Supportive care encouraged with increased rest and fluids and continue OTC decongestants.  Advised to add Chloraseptic spray and throat lozenges.  Advised that he should be feeling better within 7 to 10 days.  Reviewed returning if fever, shortness of breath or any acute worsening of symptoms.  Work note given.  Negative strep, pending COVID test.  Final Clinical Impressions(s) / UC Diagnoses   Final diagnoses:  Upper respiratory tract infection, unspecified type  Acute cough  Sore throat     Discharge Instructions      -We have tested you for COVID and strep.  We will call if any test are positive.  If you do have COVID you will need to isolate 5 days and wear a mask for 5 days.  We can talk about antiviral medication if you are positive. - Review of strep I will send antibiotics. - If you do not hear from [place] both tests are negative and you likely have a cold virus which should get better within 7 to 10 days. - Continue taking the medication that you are taking.  Consider Chloraseptic spray, ibuprofen, throat lozenges.     ED Prescriptions   None    PDMP not reviewed this encounter.   Shirlee Latch, PA-C 07/18/22 1245

## 2022-07-18 NOTE — Discharge Instructions (Addendum)
-  We have tested you for COVID and strep.  We will call if any test are positive.  If you do have COVID you will need to isolate 5 days and wear a mask for 5 days.  We can talk about antiviral medication if you are positive. - Review of strep I will send antibiotics. - If you do not hear from [place] both tests are negative and you likely have a cold virus which should get better within 7 to 10 days. - Continue taking the medication that you are taking.  Consider Chloraseptic spray, ibuprofen, throat lozenges.

## 2022-07-19 LAB — SARS CORONAVIRUS 2 (TAT 6-24 HRS): SARS Coronavirus 2: NEGATIVE

## 2022-07-20 ENCOUNTER — Other Ambulatory Visit: Payer: Self-pay

## 2022-07-20 ENCOUNTER — Encounter: Payer: Self-pay | Admitting: Family

## 2022-07-20 ENCOUNTER — Ambulatory Visit
Admission: EM | Admit: 2022-07-20 | Discharge: 2022-07-20 | Disposition: A | Discharge status: Home / Self Care | Payer: BC Managed Care – PPO | Attending: Family Medicine | Admitting: Family Medicine

## 2022-07-20 DIAGNOSIS — J069 Acute upper respiratory infection, unspecified: Secondary | ICD-10-CM | POA: Diagnosis not present

## 2022-07-20 NOTE — ED Provider Notes (Signed)
[facility]    CSN: [id number] Arrival date & time: 07/20/22  1137      History   Chief Complaint Chief Complaint  Patient presents with   work note    HPI Ryan Norton is a 26 y.o. male.   HPI   Ryan Norton presents for ongoing cough and congestion for the past week.  States he does not feel any better than he did before.  He has not been going to work since his urgent care visit on 07/18/2022.  His sore throat has improved.  Endorses fatigue, productive cough, nasal congestion.  He has chest pain only when he coughs.  Denies shortness of breath. Denies fever, chills, arthralgias, abdominal pain, vomiting, diarrhea.  Has been taking NyQuil and DayQuil with some relief.  Has history of chronic headaches.  Requests work note as he does not want to be sent home from work again.     Past Medical History:  Diagnosis Date   Anxiety    Asthma    Chicken pox     Patient Active Problem List   Diagnosis Date Noted   Insomnia 06/30/2022   Low back pain 06/30/2022   Obesity (BMI 30.0-34.9) 11/10/2020   Attention deficit 11/10/2020   Anxiety and depression 06/02/2019   Headache 06/02/2019   Vertigo 05/31/2017   Allergic rhinitis 01/25/2017   Routine general medical examination at a health care facility 07/11/2016   Preventative health care 08/06/2015   Asthma 08/06/2015    Past Surgical History:  Procedure Laterality Date   no surgical history     WISDOM TOOTH EXTRACTION         Home Medications    Prior to Admission medications   Medication Sig Start Date End Date Taking? Authorizing Provider  albuterol (PROVENTIL HFA;VENTOLIN HFA) 108 (90 Base) MCG/ACT inhaler Inhale 2 puffs into the lungs every 6 (six) hours as needed for wheezing or shortness of breath. 08/30/18   Leone Haven, MD  cetirizine (ZYRTEC) 10 MG tablet TAKE 1 TABLET BY MOUTH EVERY DAY 05/19/21   Leone Haven, MD  ipratropium (ATROVENT) 0.06 % nasal spray Place 2 sprays into both  nostrils 4 (four) times daily. 06/08/22   Margarette Canada, NP  montelukast (SINGULAIR) 10 MG tablet Take 1 tablet (10 mg total) by mouth at bedtime. 06/30/22   Burnard Hawthorne, FNP  sertraline (ZOLOFT) 50 MG tablet TAKE 1 TABLET (50 MG TOTAL) BY MOUTH DAILY. FURTHER REFILLS PCP 05/30/22   Kennyth Arnold, FNP  traZODone (DESYREL) 50 MG tablet Take 0.5-1 tablets (25-50 mg total) by mouth at bedtime as needed for sleep. 06/30/22   Burnard Hawthorne, FNP    Family History Family History  Problem Relation Age of Onset   Hyperlipidemia Father    Hypertension Father    Diabetes Paternal Grandmother    Healthy Mother     Social History Social History   Tobacco Use   Smoking status: Never   Smokeless tobacco: Never  Vaping Use   Vaping Use: Never used  Substance Use Topics   Alcohol use: No    Alcohol/week: 0.0 standard drinks of alcohol   Drug use: No     Allergies   Patient has no known allergies.   Review of Systems Review of Systems: negative unless otherwise stated in HPI.      Physical Exam Triage Vital Signs ED Triage Vitals  Enc Vitals Group     BP 07/20/22 1221 123/86  Pulse Rate 07/20/22 1221 73     Resp 07/20/22 1221 18     Temp 07/20/22 1221 98.2 F (36.8 C)     Temp Source 07/20/22 1221 Oral     SpO2 07/20/22 1221 98 %     Weight --      Height --      Head Circumference --      Peak Flow --      Pain Score 07/20/22 1219 0     Pain Loc --      Pain Edu? --      Excl. in [place]? --    No data found.  Updated Vital Signs BP 123/86 (BP Location: Left Arm)   Pulse 73   Temp 98.2 F (36.8 C) (Oral)   Resp 18   SpO2 98%   Visual Acuity Right Eye Distance:   Left Eye Distance:   Bilateral Distance:    Right Eye Near:   Left Eye Near:    Bilateral Near:     Physical Exam GEN:     alert, non-toxic appearing male in no distress     HENT:  mucus membranes moist, oropharyngeal  without lesions or  exudate, no  tonsillar hypertrophy, mild oropharyngeal  erythema , mild pale hypertrophied turbinates,  clear nasal discharge, bilateral TM  normal EYES:   pupils equal and reactive, EOMi ,  no scleral injection NECK:  normal ROM RESP:  no increased work of breathing, clear to auscultation bilaterally CVS:   regular rate and rhythm Skin:   warm and dry, no rash on visible skin , normal  skin turgor    UC Treatments / Results  Labs (all labs ordered are listed, but only abnormal results are displayed) Labs Reviewed - No data to display  EKG   Radiology No results found.  Procedures Procedures (including critical care time)  Medications Ordered in UC Medications - No data to display  Initial Impression / Assessment and Plan / UC Course  I have reviewed the triage vital signs and the nursing notes.  Pertinent labs & imaging results that were available during my care of the patient were reviewed by me and considered in my medical decision making (see chart for details).      Pt is a 26 y.o. male who presents for 1 week of  respiratory symptoms. [name] is  afebrile. Satting well on room air. Overall pt is  well appearing, well hydrated, without respiratory distress. Pulmonary exam  is unremarkable. COVID and strep testing was negative are last UC visit 2 days ago.  History consistent with  viral respiratory illness. Discussed symptomatic treatment.  Explained lack of efficacy of antibiotics in viral disease.  Work note provided per pt request.  - continue Tylenol/  Motrin as needed for discomfort/fever/irritability  - nasal saline to help with his nasal congestion - Use a mist humidifier to help with breathing - Stressed importance of hydration - Discussed return and ED precautions, understanding voiced.   Discussed MDM, treatment plan and plan for follow-up with patient/parent who agrees with plan.     Final Clinical Impressions(s) / UC Diagnoses   Final diagnoses:  URI with cough and congestion     Discharge Instructions       You have a common respiratory virus. Your COVID and strep tests were negative.    You can take Tylenol and/or Ibuprofen as needed for fever reduction and pain relief.    For cough: honey 1/2 to 1 teaspoon (  you can dilute the honey in water or another fluid).  You can also use guaifenesin and dextromethorphan for cough. You can use a humidifier for chest congestion and cough.  If you don't have a humidifier, you can sit in the bathroom with the hot shower running.      For sore throat: try warm salt water gargles, cepacol lozenges, throat spray, warm tea or water with lemon/honey, popsicles or ice, or OTC cold relief medicine for throat discomfort.    For congestion: take a daily anti-histamine like Zyrtec, Claritin, and a oral decongestant, such as pseudoephedrine.  You can also use Flonase 1-2 sprays in each nostril daily.    It is important to stay hydrated: drink plenty of fluids (water, gatorade/powerade/pedialyte, juices, or teas) to keep your throat moisturized and help further relieve irritation/discomfort.    Return or go to the Emergency Department if symptoms worsen or do not improve in the next few days        ED Prescriptions   None    PDMP not reviewed this encounter.   Katha Cabal, DO 07/20/22 1259

## 2022-07-20 NOTE — ED Triage Notes (Addendum)
Pt is back with the same sxs of his last visit on 07/18/2022, he is just needing a work note.

## 2022-07-20 NOTE — Discharge Instructions (Signed)
You have a common respiratory virus. Your COVID and strep tests were negative.    You can take Tylenol and/or Ibuprofen as needed for fever reduction and pain relief.    For cough: honey 1/2 to 1 teaspoon (you can dilute the honey in water or another fluid).  You can also use guaifenesin and dextromethorphan for cough. You can use a humidifier for chest congestion and cough.  If you don't have a humidifier, you can sit in the bathroom with the hot shower running.      For sore throat: try warm salt water gargles, cepacol lozenges, throat spray, warm tea or water with lemon/honey, popsicles or ice, or OTC cold relief medicine for throat discomfort.    For congestion: take a daily anti-histamine like Zyrtec, Claritin, and a oral decongestant, such as pseudoephedrine.  You can also use Flonase 1-2 sprays in each nostril daily.    It is important to stay hydrated: drink plenty of fluids (water, gatorade/powerade/pedialyte, juices, or teas) to keep your throat moisturized and help further relieve irritation/discomfort.    Return or go to the Emergency Department if symptoms worsen or do not improve in the next few days

## 2022-07-22 ENCOUNTER — Other Ambulatory Visit: Payer: Self-pay | Admitting: Family

## 2022-07-22 DIAGNOSIS — G4700 Insomnia, unspecified: Secondary | ICD-10-CM

## 2022-07-24 NOTE — Telephone Encounter (Signed)
The patient would need a visit to determine appropriate treatment options. Please contact him to get him set up with me or another provider in clinic to discuss his symptoms and discuss treatment options. Thanks.

## 2022-08-11 ENCOUNTER — Ambulatory Visit (INDEPENDENT_AMBULATORY_CARE_PROVIDER_SITE_OTHER): Payer: BC Managed Care – PPO | Admitting: Family

## 2022-08-11 ENCOUNTER — Encounter: Payer: Self-pay | Admitting: Family

## 2022-08-11 VITALS — BP 118/80 | HR 72 | Temp 98.1°F | Ht 68.0 in | Wt 211.4 lb

## 2022-08-11 DIAGNOSIS — G4700 Insomnia, unspecified: Secondary | ICD-10-CM

## 2022-08-11 DIAGNOSIS — F419 Anxiety disorder, unspecified: Secondary | ICD-10-CM

## 2022-08-11 DIAGNOSIS — Z23 Encounter for immunization: Secondary | ICD-10-CM | POA: Diagnosis not present

## 2022-08-11 DIAGNOSIS — F32A Depression, unspecified: Secondary | ICD-10-CM

## 2022-08-11 DIAGNOSIS — J309 Allergic rhinitis, unspecified: Secondary | ICD-10-CM

## 2022-08-11 DIAGNOSIS — M545 Low back pain, unspecified: Secondary | ICD-10-CM | POA: Diagnosis not present

## 2022-08-11 NOTE — Assessment & Plan Note (Signed)
Improved.  Advised to start trazodone 25 mg to see if feels less groggy.  He will let us know how he is doing.

## 2022-08-11 NOTE — Progress Notes (Signed)
Subjective:    Patient ID: Ryan Norton, male    DOB: Jan 17, 1996, 26 y.o.   MRN: [id number]  CC: Ryan Norton is a 26 y.o. male who presents today for follow up.   HPI: Feels well today.  No new complaints.   Follow-up, allergic rhinitis.  Started Singulair with referral to ENT however he missed appointment.   He feels allergies have improved.  Insomnia- Started on trazodone 50mg  however he did not start with 25 mg tablet and some mornings when he has to get up at 5 AM he feels trazodone causes him to feel groggy.  Otherwise he is pleased with medication.  He remains compliant with Zoloft 50 mg.  No thoughts of hurting himself or anyone  Low back pain is unchanged.  He has not been able to reach physical therapy.   HISTORY:  Past Medical History:  Diagnosis Date   Anxiety    Asthma    Chicken pox    Past Surgical History:  Procedure Laterality Date   no surgical history     WISDOM TOOTH EXTRACTION     Family History  Problem Relation Age of Onset   Hyperlipidemia Father    Hypertension Father    Diabetes Paternal Grandmother    Healthy Mother     Allergies: Patient has no known allergies. Current Outpatient Medications on File Prior to Visit  Medication Sig Dispense Refill   albuterol (PROVENTIL HFA;VENTOLIN HFA) 108 (90 Base) MCG/ACT inhaler Inhale 2 puffs into the lungs every 6 (six) hours as needed for wheezing or shortness of breath. 1 Inhaler 0   cetirizine (ZYRTEC) 10 MG tablet TAKE 1 TABLET BY MOUTH EVERY DAY 90 tablet 3   ipratropium (ATROVENT) 0.06 % nasal spray Place 2 sprays into both nostrils 4 (four) times daily. 15 mL 12   montelukast (SINGULAIR) 10 MG tablet Take 1 tablet (10 mg total) by mouth at bedtime. 30 tablet 2   sertraline (ZOLOFT) 50 MG tablet TAKE 1 TABLET (50 MG TOTAL) BY MOUTH DAILY. FURTHER REFILLS PCP 90 tablet 2   traZODone (DESYREL) 50 MG tablet TAKE 0.5-1 TABLETS BY MOUTH AT BEDTIME AS NEEDED FOR SLEEP. 90 tablet 2   No  current facility-administered medications on file prior to visit.    Social History   Tobacco Use   Smoking status: Never   Smokeless tobacco: Never  Vaping Use   Vaping Use: Never used  Substance Use Topics   Alcohol use: No    Alcohol/week: 0.0 standard drinks of alcohol   Drug use: No    Review of Systems  Constitutional:  Negative for chills and fever.  Respiratory:  Negative for cough.   Cardiovascular:  Negative for chest pain and palpitations.  Gastrointestinal:  Negative for nausea and vomiting.  Musculoskeletal:  Positive for back pain.  Psychiatric/Behavioral:  Negative for sleep disturbance and suicidal ideas. The patient is not nervous/anxious.       Objective:    BP 118/80 (BP Location: Left Arm, Patient Position: Sitting, Cuff Size: Normal)   Pulse 72   Temp 98.1 F (36.7 C) (Oral)   Ht 5\' 8"  (1.727 m)   Wt 211 lb 6.4 oz (95.9 kg)   SpO2 96%   BMI 32.14 kg/m  BP Readings from Last 3 Encounters:  08/11/22 118/80  07/20/22 123/86  07/18/22 (!) 140/87   Wt Readings from Last 3 Encounters:  08/11/22 211 lb 6.4 oz (95.9 kg)  07/18/22 213 lb 3 oz (96.7 kg)  06/30/22 213 lb 1.6 oz (96.7 kg)    Physical Exam Vitals reviewed.  Constitutional:      Appearance: He is well-developed.  Cardiovascular:     Rate and Rhythm: Regular rhythm.     Heart sounds: Normal heart sounds.  Pulmonary:     Effort: Pulmonary effort is normal. No respiratory distress.     Breath sounds: Normal breath sounds. No wheezing, rhonchi or rales.  Skin:    General: Skin is warm and dry.  Neurological:     Mental Status: He is alert.  Psychiatric:        Speech: Speech normal.        Behavior: Behavior normal.        Assessment & Plan:   Problem List Items Addressed This Visit       Respiratory   Allergic rhinitis    Improved with the addition of Singulair.  No thoughts of hurting himself or anyone else.  advised to continue medication.  Provided number for [facility] so he may reschedule this appointment.         Other   Anxiety and depression (Chronic)    Chronic, stable.  Continue Zoloft 50 mg      Insomnia    Improved.  Advised to start trazodone 25 mg to see if feels less groggy.  He will let us know how he is doing.      Low back pain    Chronic, unchanged.  Awaiting PT appointment. I have sent a  Message to our referral coordinator to follow-up in this regard      Other Visit Diagnoses     Need for immunization against influenza    -  Primary   Relevant Orders   Flu Vaccine QUAD 34mo+IM (Fluarix, Fluzone & Alfiuria Quad PF) (Completed)        I am having Ryan Norton maintain his albuterol, cetirizine, sertraline, ipratropium, montelukast, and traZODone.   No orders of the defined types were placed in this encounter.   Return precautions given.   Risks, benefits, and alternatives of the medications and treatment plan prescribed today were discussed, and patient expressed understanding.   Education regarding symptom management and diagnosis given to patient on AVS.  Continue to follow with Leone Haven, MD for routine health maintenance.   Ryan Norton and I agreed with plan.   Ryan Paris, FNP

## 2022-08-11 NOTE — Patient Instructions (Addendum)
Call to reschedule with [facility]  [phone]  Nice to see you

## 2022-08-11 NOTE — Assessment & Plan Note (Signed)
Chronic, unchanged.  Awaiting PT appointment. I have sent a  Message to our referral coordinator to follow-up in this regard

## 2022-08-11 NOTE — Assessment & Plan Note (Signed)
Improved with the addition of Singulair.  No thoughts of hurting himself or anyone else.  advised to continue medication.  Provided number for [facility] so he may reschedule this appointment.

## 2022-08-11 NOTE — Assessment & Plan Note (Signed)
Chronic, stable.  Continue Zoloft 50mg.  

## 2022-08-21 ENCOUNTER — Encounter: Payer: BC Managed Care – PPO | Admitting: Family Medicine

## 2022-09-27 ENCOUNTER — Ambulatory Visit (INDEPENDENT_AMBULATORY_CARE_PROVIDER_SITE_OTHER): Payer: BC Managed Care – PPO | Admitting: Family Medicine

## 2022-09-27 ENCOUNTER — Encounter: Payer: Self-pay | Admitting: Family Medicine

## 2022-09-27 VITALS — BP 124/80 | HR 63 | Temp 98.4°F | Ht 68.0 in | Wt 210.0 lb

## 2022-09-27 DIAGNOSIS — R7989 Other specified abnormal findings of blood chemistry: Secondary | ICD-10-CM

## 2022-09-27 DIAGNOSIS — Z0000 Encounter for general adult medical examination without abnormal findings: Secondary | ICD-10-CM

## 2022-09-27 DIAGNOSIS — E669 Obesity, unspecified: Secondary | ICD-10-CM

## 2022-09-27 NOTE — Assessment & Plan Note (Signed)
Physical exam completed.  Encouraged healthy diet and exercise.  Discussed making sure he gets plenty of vegetables and reduces fatty foods and sugary foods.  He declines COVID vaccination.  Flu shot and tetanus vaccinations are up-to-date.  Lab work as outlined.

## 2022-09-27 NOTE — Progress Notes (Signed)
Tommi Rumps, MD Phone: [phone]  Ryan Norton is a 26 y.o. male who presents today for CPE.   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 Colonoscopy: Not indicated Prostate cancer screening: Not indicated Family history-  Prostate cancer: No  Colon cancer: No Vaccines-   Flu: UTD  Tetanus: UTD  COVID19: declines HIV screening: declined in past Hep C Screening: UTD Tobacco use: no Alcohol use: no Illicit Drug use: no Dentist: no Ophthalmology: no   Active Ambulatory Problems    Diagnosis Date Noted   Preventative health care 08/06/2015   Asthma 08/06/2015   Routine general medical examination at a health care facility 07/11/2016   Allergic rhinitis 01/25/2017   Vertigo 05/31/2017   Anxiety and depression 06/02/2019   Headache 06/02/2019   Obesity (BMI 30.0-34.9) 11/10/2020   Attention deficit 11/10/2020   Insomnia 06/30/2022   Low back pain 06/30/2022   Resolved Ambulatory Problems    Diagnosis Date Noted   URI (upper respiratory infection) 09/27/2016   Canker sore 09/27/2016   Past Medical History:  Diagnosis Date   Anxiety    Chicken pox     Family History  Problem Relation Age of Onset   Hyperlipidemia Father    Hypertension Father    Diabetes Paternal Grandmother    Healthy Mother     Social History   Socioeconomic History   Marital status: Single    Spouse name: Not on file   Number of children: Not on file   Years of education: Not on file   Highest education level: Not on file  Occupational History   Not on file  Tobacco Use   Smoking status: Never   Smokeless tobacco: Never  Vaping Use   Vaping Use: Never used  Substance and Sexual Activity   Alcohol use: No    Alcohol/week: 0.0 standard drinks of alcohol   Drug use: No   Sexual activity: Never  Other Topics Concern   Not on file  Social History  Narrative   Not on file   Social Determinants of Health   Financial Resource Strain: Not on file  Food Insecurity: Not on file  Transportation Needs: Not on file  Physical Activity: Not on file  Stress: Not on file  Social Connections: Not on file  Intimate Partner Violence: Not on file    ROS  General:  Negative for nexplained weight loss, fever Skin: Negative for new or changing mole, sore that won't heal HEENT: Negative for trouble hearing, trouble seeing, ringing in ears, mouth sores, hoarseness, change in voice, dysphagia. CV:  Negative for chest pain, dyspnea, edema, palpitations Resp: Negative for cough, dyspnea, hemoptysis GI: Negative for nausea, vomiting, diarrhea, constipation, abdominal pain, melena, hematochezia. GU: Negative for dysuria, incontinence, urinary hesitance, hematuria, vaginal or penile discharge, polyuria, sexual difficulty, lumps in testicle or breasts MSK: Negative for muscle cramps or aches, joint pain or swelling Neuro: Negative for headaches, weakness, numbness, dizziness, passing out/fainting Psych: Negative for depression, anxiety, memory problems  Objective  Physical Exam Vitals:   09/27/22 1436  BP: 124/80  Pulse: 63  Temp: 98.4 F (36.9 C)  SpO2: 97%    BP Readings from Last 3 Encounters:  09/27/22 124/80  08/11/22 118/80  07/20/22 123/86   Wt Readings from Last 3 Encounters:  09/27/22 210 lb (95.3 kg)  08/11/22 211 lb 6.4 oz (95.9 kg)  07/18/22 213 lb 3 oz (96.7 kg)    Physical Exam Constitutional:      General: He is not in acute distress.    Appearance: He is not diaphoretic.  Cardiovascular:     Rate and Rhythm: Normal rate and regular rhythm.     Heart sounds: Normal heart sounds.  Pulmonary:     Effort: Pulmonary effort is normal.     Breath sounds: Normal breath sounds.  Abdominal:     General: Bowel sounds are normal. There is no distension.     Palpations: Abdomen is soft.     Tenderness: There is no  abdominal tenderness.  Musculoskeletal:     Right lower leg: No edema.     Left lower leg: No edema.  Lymphadenopathy:     Cervical: No cervical adenopathy.  Skin:    General: Skin is warm and dry.  Neurological:     Mental Status: He is alert.  Psychiatric:        Mood and Affect: Mood normal.      Assessment/Plan:   Problem List Items Addressed This Visit     Obesity (BMI 30.0-34.9)   Relevant Orders   HgB A1c   Routine general medical examination at a health care facility - Primary    Physical exam completed.  Encouraged healthy diet and exercise.  Discussed making sure he gets plenty of vegetables and reduces fatty foods and sugary foods.  He declines COVID vaccination.  Flu shot and tetanus vaccinations are up-to-date.  Lab work as outlined.      Other Visit Diagnoses     Abnormal LFTs       Relevant Orders   Comp Met (CMET)       Return in about 1 year (around 09/28/2023) for CPE.   Tommi Rumps, MD [place]

## 2022-09-28 LAB — COMPREHENSIVE METABOLIC PANEL
ALT: 107 U/L — ABNORMAL HIGH (ref 0–53)
AST: 45 U/L — ABNORMAL HIGH (ref 0–37)
Albumin: 4.7 g/dL (ref 3.5–5.2)
Alkaline Phosphatase: 85 U/L (ref 39–117)
BUN: 11 mg/dL (ref 6–23)
CO2: 31 mEq/L (ref 19–32)
Calcium: 9.9 mg/dL (ref 8.4–10.5)
Chloride: 100 mEq/L (ref 96–112)
Creatinine, Ser: 1.01 mg/dL (ref 0.40–1.50)
GFR: 102.38 mL/min (ref >60.00)
Glucose, Bld: 90 mg/dL (ref 70–99)
Potassium: 4.2 mEq/L (ref 3.5–5.1)
Sodium: 138 mEq/L (ref 135–145)
Total Bilirubin: 0.3 mg/dL (ref 0.2–1.2)
Total Protein: 7.5 g/dL (ref 6.0–8.3)

## 2022-09-28 LAB — HEMOGLOBIN A1C: Hgb A1c MFr Bld: 5.8 % (ref 4.6–6.5)

## 2022-09-30 ENCOUNTER — Other Ambulatory Visit: Payer: Self-pay | Admitting: Family

## 2022-09-30 DIAGNOSIS — J3089 Other allergic rhinitis: Secondary | ICD-10-CM

## 2022-10-04 ENCOUNTER — Telehealth: Payer: Self-pay

## 2022-10-04 NOTE — Telephone Encounter (Signed)
LMOM for pt to CB in regards to labs 

## 2022-10-06 NOTE — Progress Notes (Signed)
Redone a letter to patient.  Tressa Maldonado,cma

## 2022-12-02 IMAGING — US US ABDOMEN LIMITED
1 series · 14 of 25 positions shown · non-contrast
Comparison: None.

CLINICAL DATA: Elevated liver enzymes

EXAM:
ULTRASOUND ABDOMEN LIMITED RIGHT UPPER QUADRANT

[Series 1: us abdomen limited · 0.22mm/px · 14 of 41 slices shown]
[im 1/41]
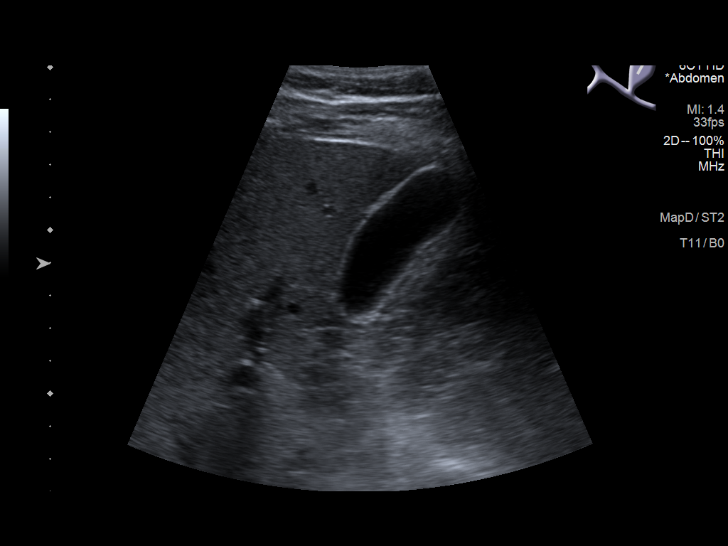
[im 4/41]
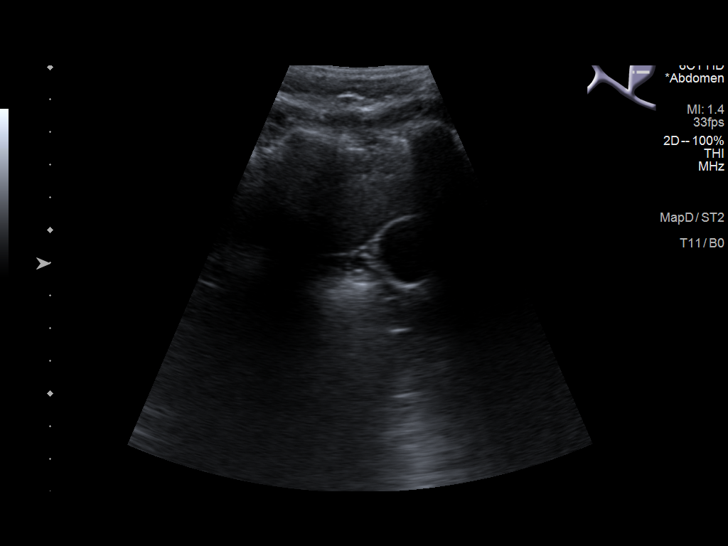
[im 7/41]
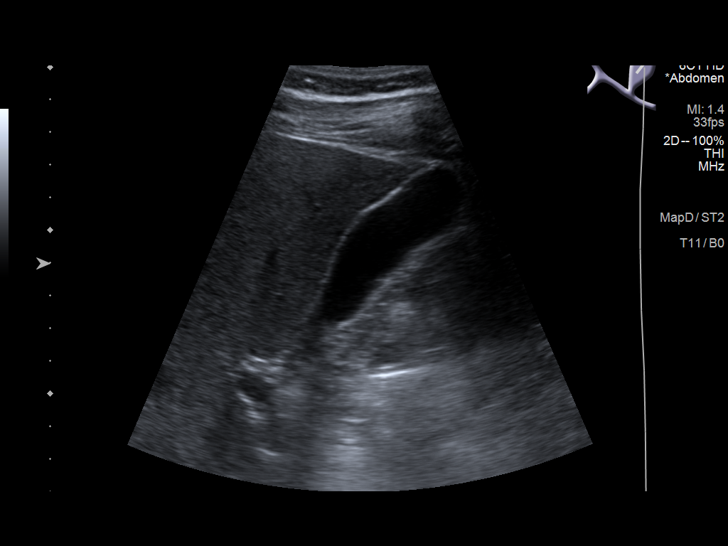
[im 11/41]
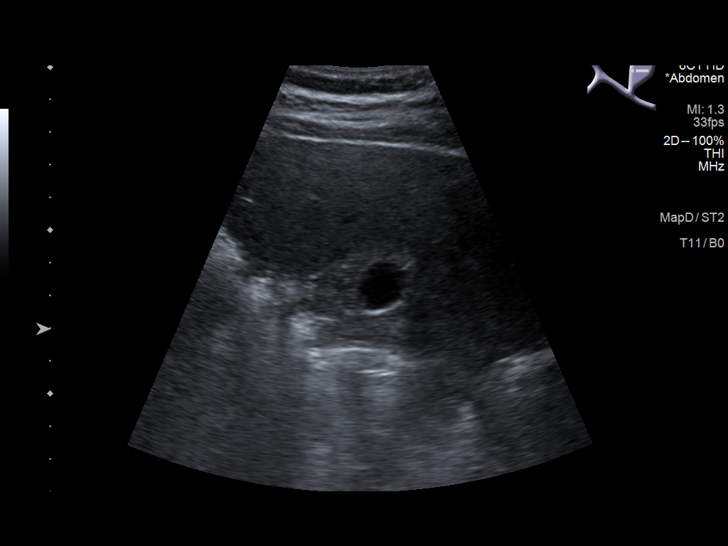
[im 14/41]
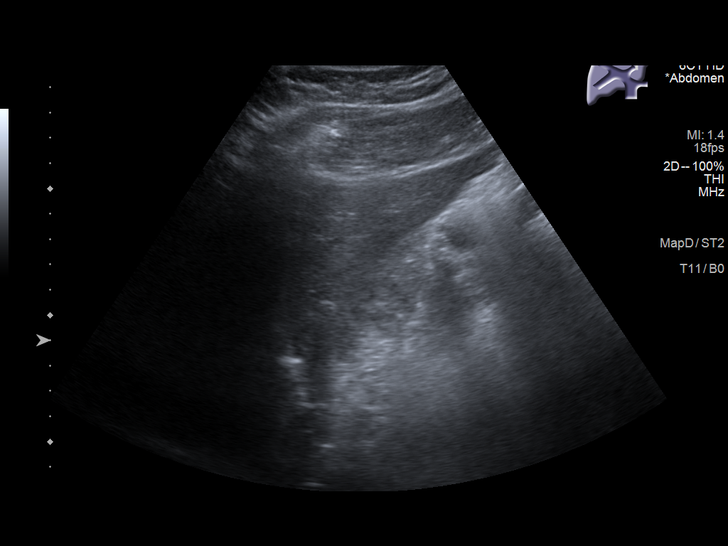
[im 16/41]
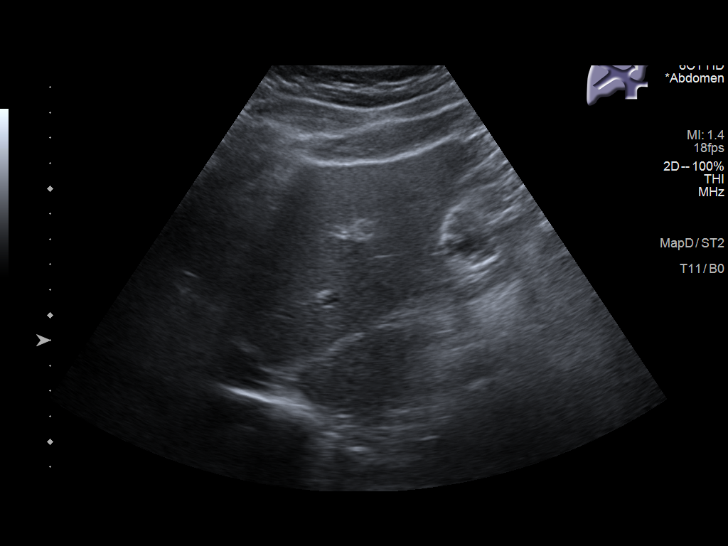
[im 19/41]
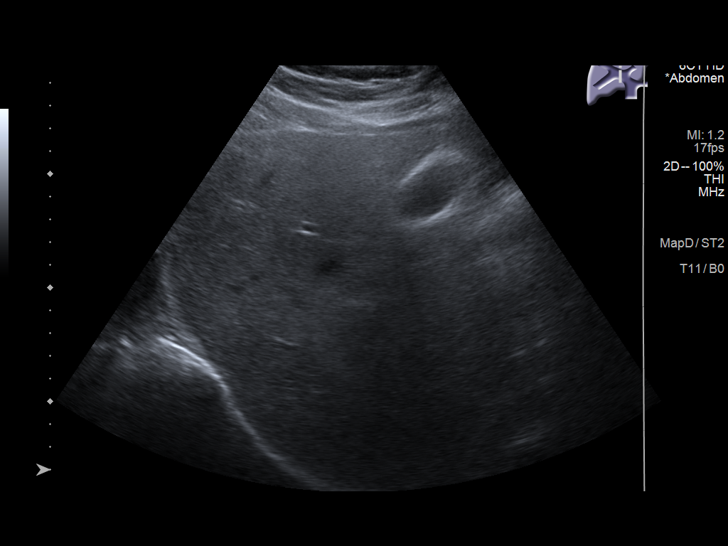
[im 22/41]
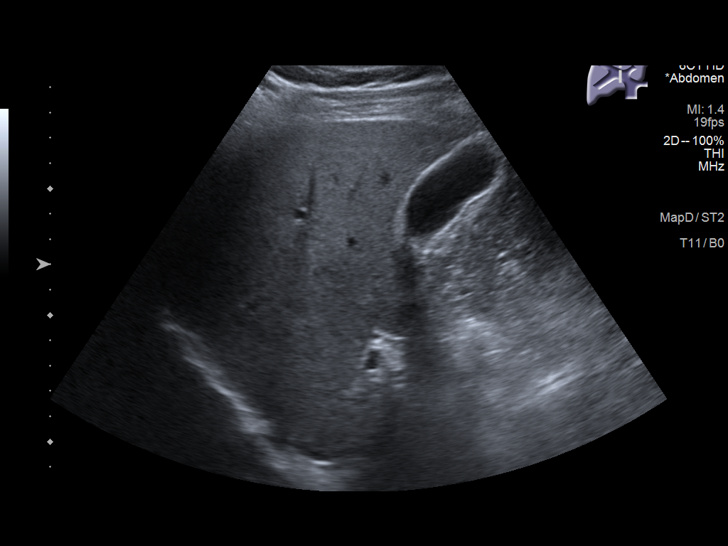
[im 26/41]
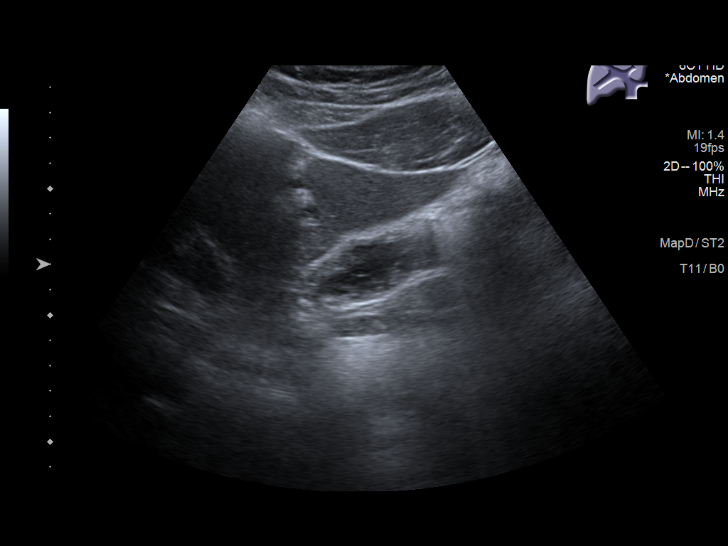
[im 27/41]
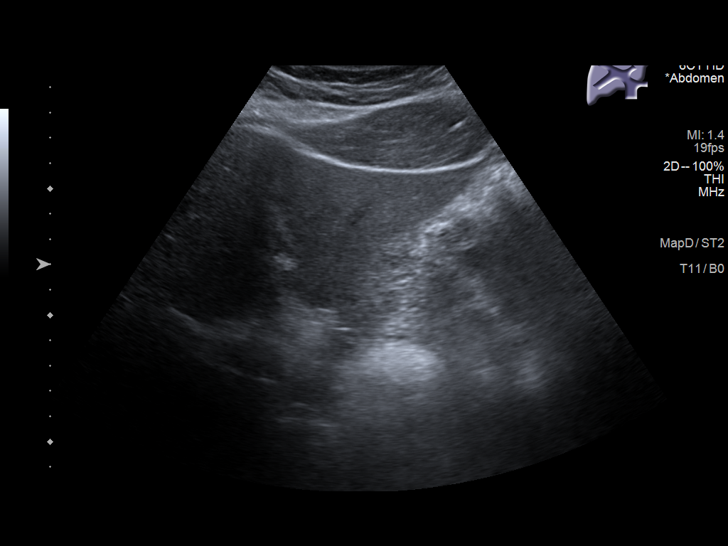
[im 31/41]
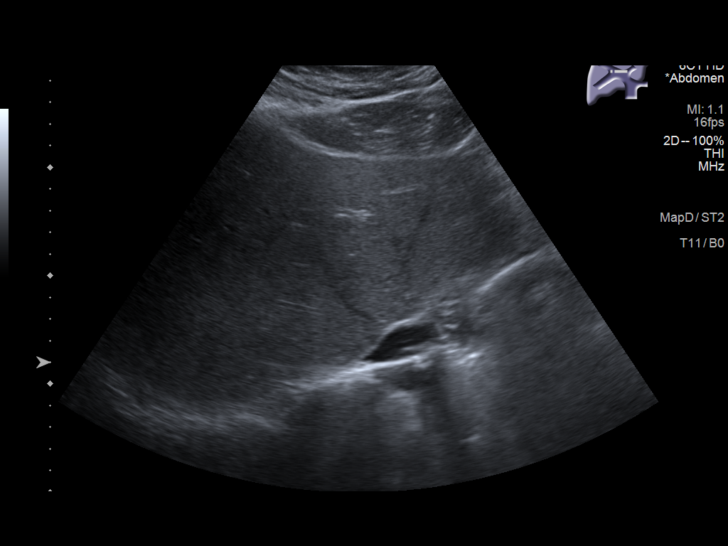
[im 34/41]
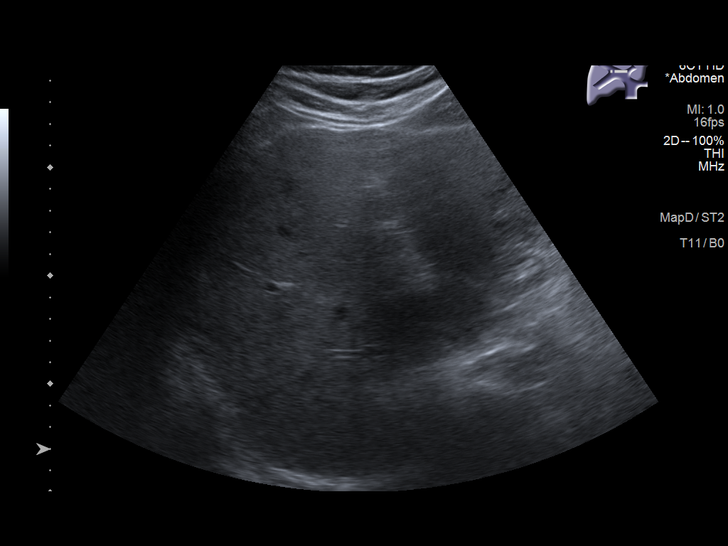
[im 37/41]
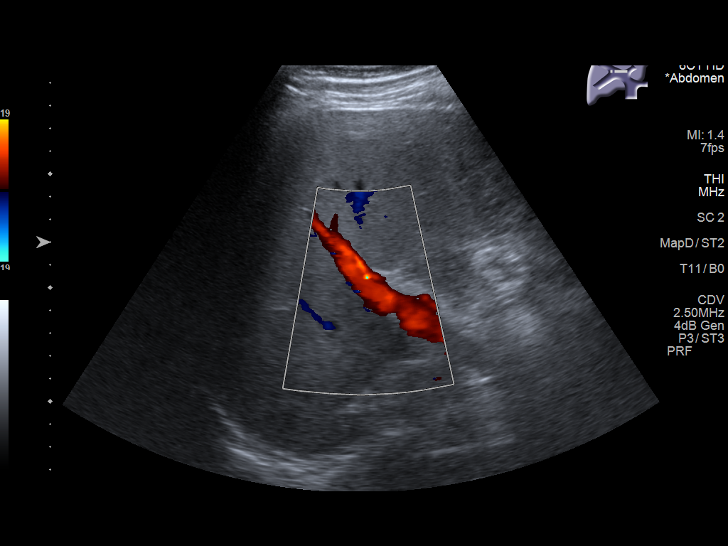
[im 41/41]
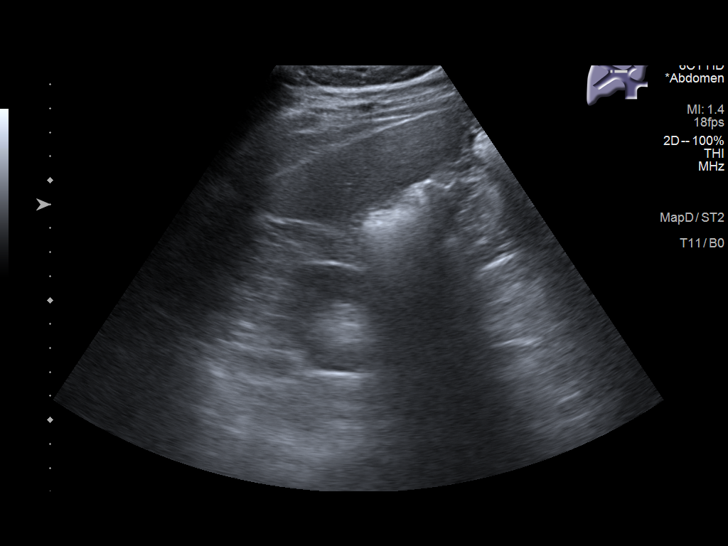

[14 of 25 positions shown; findings below may reference images not displayed]

FINDINGS: Gallbladder:

No gallstones or wall thickening visualized. No sonographic Murphy
sign noted by sonographer.

Common bile duct:

Diameter: 2 mm.

Liver:

No focal lesion identified. Within normal limits in parenchymal
echogenicity. Portal vein is patent on color Doppler imaging with
normal direction of blood flow towards the liver.

Other: None.
IMPRESSION: Unremarkable right upper quadrant ultrasound.

## 2022-12-18 ENCOUNTER — Ambulatory Visit
Admission: EM | Admit: 2022-12-18 | Discharge: 2022-12-18 | Disposition: A | Discharge status: Home / Self Care | Payer: BC Managed Care – PPO | Attending: Emergency Medicine | Admitting: Emergency Medicine

## 2022-12-18 DIAGNOSIS — M542 Cervicalgia: Secondary | ICD-10-CM

## 2022-12-18 MED ORDER — PREDNISONE 20 MG PO TABS: 40.0000 mg | ORAL_TABLET | Freq: Every day | ORAL | 0 refills | Status: DC

## 2022-12-18 MED ORDER — KETOROLAC TROMETHAMINE 30 MG/ML IJ SOLN
30.0000 mg | Freq: Once | INTRAMUSCULAR | Status: AC
Start: 2022-12-18 — End: 2022-12-18
  Administered 2022-12-18: 30 mg via INTRAMUSCULAR

## 2022-12-18 MED ORDER — CYCLOBENZAPRINE HCL 10 MG PO TABS: 10.0000 mg | ORAL_TABLET | Freq: Every day | ORAL | 0 refills | Status: DC

## 2022-12-18 NOTE — ED Provider Notes (Signed)
[facility]    CSN: [id number] Arrival date & time: 12/18/22  1005      History   Chief Complaint Chief Complaint  Patient presents with   Neck Pain    HPI Ryan Norton is a 27 y.o. male.   Patient presents for evaluation of left-sided neck pain beginning today upon awakening.  Endorses that he rolled over twice to stop his alarm clock and began to experience abrupt neck pain.  Pain is only on the left side, radiates down to the base of the neck intermittently.  Pain is constant rating to 2 out of 10, worsens with with movement.  Can be felt with all neck movement.  Denies numbness or tingling, rigidity.  Has not attempted treatment..  Denies prior injury.    Past Medical History:  Diagnosis Date   Anxiety    Asthma    Chicken pox     Patient Active Problem List   Diagnosis Date Noted   Insomnia 06/30/2022   Low back pain 06/30/2022   Obesity (BMI 30.0-34.9) 11/10/2020   Attention deficit 11/10/2020   Anxiety and depression 06/02/2019   Headache 06/02/2019   Vertigo 05/31/2017   Allergic rhinitis 01/25/2017   Routine general medical examination at a health care facility 07/11/2016   Preventative health care 08/06/2015   Asthma 08/06/2015    Past Surgical History:  Procedure Laterality Date   no surgical history     WISDOM TOOTH EXTRACTION         Home Medications    Prior to Admission medications   Medication Sig Start Date End Date Taking? Authorizing Provider  albuterol (PROVENTIL HFA;VENTOLIN HFA) 108 (90 Base) MCG/ACT inhaler Inhale 2 puffs into the lungs every 6 (six) hours as needed for wheezing or shortness of breath. 08/30/18  Yes Leone Haven, MD  cetirizine (ZYRTEC) 10 MG tablet TAKE 1 TABLET BY MOUTH EVERY DAY 05/19/21  Yes Leone Haven, MD  ipratropium (ATROVENT) 0.06 % nasal spray Place 2 sprays into both nostrils 4 (four) times daily. 06/08/22  Yes Margarette Canada, NP  montelukast (SINGULAIR) 10 MG tablet TAKE 1 TABLET BY  MOUTH EVERYDAY AT BEDTIME 10/03/22  Yes Leone Haven, MD  sertraline (ZOLOFT) 50 MG tablet TAKE 1 TABLET (50 MG TOTAL) BY MOUTH DAILY. FURTHER REFILLS PCP 05/30/22  Yes Dutch Quint B, FNP  traZODone (DESYREL) 50 MG tablet TAKE 0.5-1 TABLETS BY MOUTH AT BEDTIME AS NEEDED FOR SLEEP. 07/24/22  Yes Leone Haven, MD    Family History Family History  Problem Relation Age of Onset   Hyperlipidemia Father    Hypertension Father    Diabetes Paternal Grandmother    Healthy Mother     Social History Social History   Tobacco Use   Smoking status: Never   Smokeless tobacco: Never  Vaping Use   Vaping Use: Never used  Substance Use Topics   Alcohol use: No    Alcohol/week: 0.0 standard drinks of alcohol   Drug use: No     Allergies   Patient has no known allergies.   Review of Systems Review of Systems  Constitutional: Negative.   HENT: Negative.    Respiratory: Negative.    Cardiovascular: Negative.   Musculoskeletal:  Positive for neck pain. Negative for arthralgias, back pain, gait problem, joint swelling, myalgias and neck stiffness.  Skin: Negative.   Neurological: Negative.      Physical Exam Triage Vital Signs ED Triage Vitals  Enc Vitals Group  BP 12/18/22 1042 135/84     Pulse Rate 12/18/22 1042 69     Resp 12/18/22 1042 18     Temp 12/18/22 1042 98.2 F (36.8 C)     Temp Source 12/18/22 1042 Oral     SpO2 12/18/22 1042 98 %     Weight --      Height --      Head Circumference --      Peak Flow --      Pain Score 12/18/22 1044 6     Pain Loc --      Pain Edu? --      Excl. in [place]? --    No data found.  Updated Vital Signs BP 135/84 (BP Location: Left Arm)   Pulse 69   Temp 98.2 F (36.8 C) (Oral)   Resp 18   SpO2 98%   Visual Acuity Right Eye Distance:   Left Eye Distance:   Bilateral Distance:    Right Eye Near:   Left Eye Near:    Bilateral Near:     Physical Exam Constitutional:      Appearance: Normal appearance.   Eyes:     Extraocular Movements: Extraocular movements intact.  Neck:      Comments: Tenderness is present along the base of the left lateral aspect of the neck, no ecchymosis, swelling or deformity, able to complete range of motion but pain is elicited with rotation, no rigidity or crepitus present, 2+ carotid pulse Pulmonary:     Effort: Pulmonary effort is normal.  Neurological:     Mental Status: He is alert.      UC Treatments / Results  Labs (all labs ordered are listed, but only abnormal results are displayed) Labs Reviewed - No data to display  EKG   Radiology No results found.  Procedures Procedures (including critical care time)  Medications Ordered in UC Medications - No data to display  Initial Impression / Assessment and Plan / UC Course  I have reviewed the triage vital signs and the nursing notes.  Pertinent labs & imaging results that were available during my care of the patient were reviewed by me and considered in my medical decision making (see chart for details).  Neck pain  Etiology is most likely muscular, low suspicion for spinal involvement therefore imaging deferred, discussed with patient, Toradol injection given in office and prescribed prednisone and Flexeril for outpatient use, recommended RICE, heat, such, stretching and activity as tolerated, given walking referral to orthopedics if symptoms continue to persist or worsen, work note given Final Clinical Impressions(s) / UC Diagnoses   Final diagnoses:  None   Discharge Instructions   None    ED Prescriptions   None    PDMP not reviewed this encounter.   Hans Eden, NP 12/18/22 1057

## 2022-12-18 NOTE — ED Triage Notes (Signed)
Pt states  ongoing left side neck pain for several months. This morning he rolled his neck the wrong direction. Pain 10/10

## 2022-12-18 NOTE — Discharge Instructions (Signed)
On exam tenderness is over the right muscle and does not appear to have any spine tenderness therefore imaging has been deferred  You been given an injection of Toradol today here in the office to help reduce inflammation, ideally you begin to see some relief in about 30 minutes, you may use Tylenol for the remainder of the day taking every 6 hours as needed  Starting tomorrow take prednisone in the morning with food for 5 days to continue the above process  You may use muscle relaxant at bedtime for additional comfort, be mindful this may make you feel drowsy  You may use ice or heat over the affected area in 10 to 15-minute intervals  You may massage and stretch area as tolerated, recommending at least once daily  You may place pillows behind your neck when sitting and lying for additional support and comfort  If your symptoms continue to persist past use of medication you may follow-up with orthopedics for reevaluation, information is listed on front page

## 2023-03-14 ENCOUNTER — Telehealth: Payer: Self-pay | Admitting: Family

## 2023-03-30 ENCOUNTER — Other Ambulatory Visit: Payer: Self-pay | Admitting: Family Medicine

## 2023-03-30 DIAGNOSIS — J3089 Other allergic rhinitis: Secondary | ICD-10-CM

## 2023-05-08 NOTE — Telephone Encounter (Signed)
Prescription Request  05/08/2023  LOV: Visit date not found  What is the name of the medication or equipment? sertraline   Have you contacted your pharmacy to request a refill? Yes   Which pharmacy would you like this sent to?  [id number], [place] - [address] [place] [address] Phone: [phone] Fax: [phone]    Patient notified that their request is being sent to the clinical staff for review and that they should receive a response within 2 business days.   Please advise at Mobile [phone] (mobile)

## 2023-05-09 ENCOUNTER — Other Ambulatory Visit: Payer: Self-pay | Admitting: Family

## 2023-05-09 NOTE — Telephone Encounter (Signed)
Prescription Request  05/09/2023  LOV: Visit date not found  What is the name of the medication or equipment? sertraline (ZOLOFT) 50 MG tablet  Have you contacted your pharmacy to request a refill? Yes   Which pharmacy would you like this sent to?   [id number], [place] - [address] [place] [address] Phone: [phone] Fax: [phone]    Patient notified that their request is being sent to the clinical staff for review and that they should receive a response within 2 business days.   Please advise at Mobile [phone] (mobile)

## 2023-05-09 NOTE — Telephone Encounter (Signed)
Looks like it was refilled today

## 2023-05-10 NOTE — Telephone Encounter (Signed)
Prescription was sent on 05/09/23.

## 2023-05-15 MED ORDER — SERTRALINE HCL 50 MG PO TABS: 50.0000 mg | ORAL_TABLET | Freq: Every day | ORAL | 1 refills | Status: DC

## 2023-05-15 NOTE — Telephone Encounter (Signed)
Called and spoke with Tia[facility].  They had not received the prescription that was sent in on 05/09/23 for pts sertraline.  Attempted to send electronically again but received an error message.  Called and verbally gave refill for Sertraline 50mg  #90/1.  Pt called and let him know that medication has been called into pharmacy.  Apologized for the delay.

## 2023-05-15 NOTE — Telephone Encounter (Signed)
Patient called and is very upset. He still does not have his sertraline (ZOLOFT) 50 MG tablet. He states that he is out of medication, [facility] and they tell him that they have not received a prescription from office. Patient was advised that office has sent it 2x. Please resend and call patient.

## 2023-05-15 NOTE — Addendum Note (Signed)
Addended by: Benedict Needy on: 05/15/2023 03:44 PM   Modules accepted: Orders

## 2023-07-17 ENCOUNTER — Ambulatory Visit
Admission: EM | Admit: 2023-07-17 | Discharge: 2023-07-17 | Disposition: A | Discharge status: Home / Self Care | Payer: BC Managed Care – PPO | Attending: Physician Assistant | Admitting: Physician Assistant

## 2023-07-17 DIAGNOSIS — Z1152 Encounter for screening for COVID-19: Secondary | ICD-10-CM | POA: Diagnosis not present

## 2023-07-17 DIAGNOSIS — R051 Acute cough: Secondary | ICD-10-CM

## 2023-07-17 DIAGNOSIS — R0981 Nasal congestion: Secondary | ICD-10-CM | POA: Diagnosis not present

## 2023-07-17 DIAGNOSIS — J069 Acute upper respiratory infection, unspecified: Secondary | ICD-10-CM | POA: Diagnosis not present

## 2023-07-17 LAB — SARS CORONAVIRUS 2 BY RT PCR: SARS Coronavirus 2 by RT PCR: NEGATIVE

## 2023-07-17 MED ORDER — PROMETHAZINE-DM 6.25-15 MG/5ML PO SYRP: 5.0000 mL | ORAL_SOLUTION | Freq: Four times a day (QID) | ORAL | 0 refills | Status: DC | PRN

## 2023-07-17 NOTE — ED Triage Notes (Signed)
Pt presents to UC c/o cough, runny nose, loss of voice onset Sunday night, pt does take daily allergy meds.

## 2023-07-17 NOTE — ED Provider Notes (Signed)
[facility]    CSN: [id number] Arrival date & time: 07/17/23  1241      History   Chief Complaint Chief Complaint  Patient presents with   Nasal Congestion   Cough    HPI Ryan Norton is a 27 y.o. male presenting for 2-day history of cough, congestion, and fatigue.  Denies fever, body aches, sore throat, or breathing difficulty.  No sick contacts.  History of asthma but has not needed to use inhalers.  Not taking OTC decongestants/medications. No other complaints.  HPI  Past Medical History:  Diagnosis Date   Anxiety    Asthma    Chicken pox     Patient Active Problem List   Diagnosis Date Noted   Insomnia 06/30/2022   Low back pain 06/30/2022   Obesity (BMI 30.0-34.9) 11/10/2020   Attention deficit 11/10/2020   Anxiety and depression 06/02/2019   Headache 06/02/2019   Vertigo 05/31/2017   Allergic rhinitis 01/25/2017   Routine general medical examination at a health care facility 07/11/2016   Preventative health care 08/06/2015   Asthma 08/06/2015    Past Surgical History:  Procedure Laterality Date   no surgical history     WISDOM TOOTH EXTRACTION         Home Medications    Prior to Admission medications   Medication Sig Start Date End Date Taking? Authorizing Provider  albuterol (PROVENTIL HFA;VENTOLIN HFA) 108 (90 Base) MCG/ACT inhaler Inhale 2 puffs into the lungs every 6 (six) hours as needed for wheezing or shortness of breath. 08/30/18  Yes Glori Luis, MD  cetirizine (ZYRTEC) 10 MG tablet TAKE 1 TABLET BY MOUTH EVERY DAY 05/19/21  Yes Glori Luis, MD  montelukast (SINGULAIR) 10 MG tablet TAKE 1 TABLET BY MOUTH EVERYDAY AT BEDTIME 03/30/23  Yes Glori Luis, MD  promethazine-dextromethorphan (PROMETHAZINE-DM) 6.25-15 MG/5ML syrup Take 5 mLs by mouth 4 (four) times daily as needed. 07/17/23  Yes Eusebio Friendly B, PA-C  sertraline (ZOLOFT) 50 MG tablet Take 1 tablet (50 mg total) by mouth daily. Further refills PCP  05/15/23  Yes Glori Luis, MD  traZODone (DESYREL) 50 MG tablet TAKE 0.5-1 TABLETS BY MOUTH AT BEDTIME AS NEEDED FOR SLEEP. 07/24/22  Yes Glori Luis, MD    Family History Family History  Problem Relation Age of Onset   Hyperlipidemia Father    Hypertension Father    Diabetes Paternal Grandmother    Healthy Mother     Social History Social History   Tobacco Use   Smoking status: Never   Smokeless tobacco: Never  Vaping Use   Vaping status: Never Used  Substance Use Topics   Alcohol use: No    Alcohol/week: 0.0 standard drinks of alcohol   Drug use: No     Allergies   Patient has no known allergies.   Review of Systems Review of Systems  Constitutional:  Positive for fatigue. Negative for fever.  HENT:  Positive for congestion and rhinorrhea. Negative for sinus pressure, sinus pain and sore throat.   Respiratory:  Positive for cough. Negative for shortness of breath and wheezing.   Gastrointestinal:  Negative for abdominal pain, diarrhea, nausea and vomiting.  Musculoskeletal:  Negative for myalgias.  Neurological:  Negative for weakness, light-headedness and headaches.  Hematological:  Negative for adenopathy.     Physical Exam Triage Vital Signs ED Triage Vitals  Enc Vitals Group     BP      Pulse  Resp      Temp      Temp src      SpO2      Weight      Height      Head Circumference      Peak Flow      Pain Score      Pain Loc      Pain Edu?      Excl. in GC?    No data found.  Updated Vital Signs BP (!) 144/88 (BP Location: Right Arm)   Pulse 65   Temp 97.6 F (36.4 C) (Oral)   SpO2 96%      Physical Exam Vitals and nursing note reviewed.  Constitutional:      General: He is not in acute distress.    Appearance: Normal appearance. He is well-developed. He is not ill-appearing.  HENT:     Head: Normocephalic and atraumatic.     Nose: Congestion present.     Mouth/Throat:     Mouth: Mucous membranes are moist.      Pharynx: Oropharynx is clear. No posterior oropharyngeal erythema.  Eyes:     General: No scleral icterus.    Conjunctiva/sclera: Conjunctivae normal.  Cardiovascular:     Rate and Rhythm: Normal rate and regular rhythm.     Heart sounds: Normal heart sounds.  Pulmonary:     Effort: Pulmonary effort is normal. No respiratory distress.     Breath sounds: Normal breath sounds.  Abdominal:     Palpations: Abdomen is soft.     Tenderness: There is no abdominal tenderness.  Musculoskeletal:     Cervical back: Neck supple.  Skin:    General: Skin is warm and dry.     Capillary Refill: Capillary refill takes less than 2 seconds.  Neurological:     General: No focal deficit present.     Mental Status: He is alert. Mental status is at baseline.     Motor: No weakness.     Gait: Gait normal.  Psychiatric:        Mood and Affect: Mood normal.        Behavior: Behavior normal.      UC Treatments / Results  Labs (all labs ordered are listed, but only abnormal results are displayed) Labs Reviewed  SARS CORONAVIRUS 2 BY RT PCR    EKG   Radiology No results found.  Procedures Procedures (including critical care time)  Medications Ordered in UC Medications - No data to display  Initial Impression / Assessment and Plan / UC Course  I have reviewed the triage vital signs and the nursing notes.  Pertinent labs & imaging results that were available during my care of the patient were reviewed by me and considered in my medical decision making (see chart for details).   27 year old male presenting for 2-day history of cough, congestion and fatigue.  No fever or breathing difficulty.  No sick contacts.  Vitals are stable.  He is overall well-appearing.  On exam he does have nasal congestion.  Chest clear to auscultation heart regular rate and rhythm.  COVID testing obtained.  Negative.  Advised him symptoms most consistent with viral illness.  Supportive care encouraged with  increased rest and fluids and use promethazine DM as prescribed for cough. Advised that he should be feeling better within 7 to 10 days.  Reviewed returning if fever, shortness of breath or any acute worsening of symptoms.  Work note given.   Final Clinical Impressions(s) /  UC Diagnoses   Final diagnoses:  Viral upper respiratory tract infection  Acute cough  Nasal congestion     Discharge Instructions      -Negative COVID test.  URI/COLD SYMPTOMS: Your exam today is consistent with a viral illness. Antibiotics are not indicated at this time. Use medications as directed, including cough syrup, nasal saline, and decongestants. Your symptoms should improve over the next few days and resolve within 7-10 days. Increase rest and fluids. F/u if symptoms worsen or predominate such as sore throat, ear pain, productive cough, shortness of breath, or if you develop high fevers or worsening fatigue over the next several days.        ED Prescriptions     Medication Sig Dispense Auth. Provider   promethazine-dextromethorphan (PROMETHAZINE-DM) 6.25-15 MG/5ML syrup Take 5 mLs by mouth 4 (four) times daily as needed. 118 mL Shirlee Latch, PA-C      PDMP not reviewed this encounter.    Shirlee Latch, PA-C 07/17/23 1427

## 2023-07-17 NOTE — Discharge Instructions (Signed)
-  Negative COVID test.  URI/COLD SYMPTOMS: Your exam today is consistent with a viral illness. Antibiotics are not indicated at this time. Use medications as directed, including cough syrup, nasal saline, and decongestants. Your symptoms should improve over the next few days and resolve within 7-10 days. Increase rest and fluids. F/u if symptoms worsen or predominate such as sore throat, ear pain, productive cough, shortness of breath, or if you develop high fevers or worsening fatigue over the next several days.

## 2023-08-16 ENCOUNTER — Ambulatory Visit
Admission: EM | Admit: 2023-08-16 | Discharge: 2023-08-16 | Disposition: A | Discharge status: Home / Self Care | Payer: BC Managed Care – PPO | Attending: Family Medicine | Admitting: Family Medicine

## 2023-08-16 ENCOUNTER — Encounter: Payer: Self-pay | Admitting: Emergency Medicine

## 2023-08-16 DIAGNOSIS — B9789 Other viral agents as the cause of diseases classified elsewhere: Secondary | ICD-10-CM | POA: Insufficient documentation

## 2023-08-16 DIAGNOSIS — J069 Acute upper respiratory infection, unspecified: Secondary | ICD-10-CM

## 2023-08-16 DIAGNOSIS — Z1152 Encounter for screening for COVID-19: Secondary | ICD-10-CM | POA: Insufficient documentation

## 2023-08-16 DIAGNOSIS — R059 Cough, unspecified: Secondary | ICD-10-CM | POA: Insufficient documentation

## 2023-08-16 LAB — SARS CORONAVIRUS 2 BY RT PCR: SARS Coronavirus 2 by RT PCR: NEGATIVE

## 2023-08-16 LAB — GROUP A STREP BY PCR: Group A Strep by PCR: NOT DETECTED

## 2023-08-16 NOTE — ED Triage Notes (Signed)
Pt c/o cough, nasal congestion, sore throat and headache since yesterday.

## 2023-08-16 NOTE — ED Provider Notes (Signed)
[facility]    CSN: [id number] Arrival date & time: 08/16/23  1229      History   Chief Complaint Chief Complaint  Patient presents with   Cough   Sore Throat   Nasal Congestion   Headache    HPI Ryan Norton is a 27 y.o. male.   HPI  History obtained from the patient. Ryan Norton presents for cough, sore throat, nasal congestion and headache that started last night.  No known sick contacts. No fever, vomiting, diarrhea.  Took nothing for his symptoms.        Past Medical History:  Diagnosis Date   Anxiety    Asthma    Chicken pox     Patient Active Problem List   Diagnosis Date Noted   Insomnia 06/30/2022   Low back pain 06/30/2022   Obesity (BMI 30.0-34.9) 11/10/2020   Attention deficit 11/10/2020   Anxiety and depression 06/02/2019   Headache 06/02/2019   Vertigo 05/31/2017   Allergic rhinitis 01/25/2017   Routine general medical examination at a health care facility 07/11/2016   Preventative health care 08/06/2015   Asthma 08/06/2015    Past Surgical History:  Procedure Laterality Date   no surgical history     WISDOM TOOTH EXTRACTION         Home Medications    Prior to Admission medications   Medication Sig Start Date End Date Taking? Authorizing Provider  albuterol (PROVENTIL HFA;VENTOLIN HFA) 108 (90 Base) MCG/ACT inhaler Inhale 2 puffs into the lungs every 6 (six) hours as needed for wheezing or shortness of breath. 08/30/18   Glori Luis, MD  cetirizine (ZYRTEC) 10 MG tablet TAKE 1 TABLET BY MOUTH EVERY DAY 05/19/21   Glori Luis, MD  montelukast (SINGULAIR) 10 MG tablet TAKE 1 TABLET BY MOUTH EVERYDAY AT BEDTIME 03/30/23   Glori Luis, MD  promethazine-dextromethorphan (PROMETHAZINE-DM) 6.25-15 MG/5ML syrup Take 5 mLs by mouth 4 (four) times daily as needed. 07/17/23   Shirlee Latch, PA-C  sertraline (ZOLOFT) 50 MG tablet Take 1 tablet (50 mg total) by mouth daily. Further refills PCP 05/15/23   Glori Luis, MD  traZODone (DESYREL) 50 MG tablet TAKE 0.5-1 TABLETS BY MOUTH AT BEDTIME AS NEEDED FOR SLEEP. 07/24/22   Glori Luis, MD    Family History Family History  Problem Relation Age of Onset   Hyperlipidemia Father    Hypertension Father    Diabetes Paternal Grandmother    Healthy Mother     Social History Social History   Tobacco Use   Smoking status: Never   Smokeless tobacco: Never  Vaping Use   Vaping status: Never Used  Substance Use Topics   Alcohol use: No    Alcohol/week: 0.0 standard drinks of alcohol   Drug use: No     Allergies   Patient has no known allergies.   Review of Systems Review of Systems: negative unless otherwise stated in HPI.      Physical Exam Triage Vital Signs ED Triage Vitals  Encounter Vitals Group     BP 08/16/23 1303 129/84     Systolic BP Percentile --      Diastolic BP Percentile --      Pulse Rate 08/16/23 1303 90     Resp 08/16/23 1303 18     Temp 08/16/23 1303 98.3 F (36.8 C)     Temp Source 08/16/23 1303 Oral     SpO2 08/16/23 1303 96 %  Weight --      Height --      Head Circumference --      Peak Flow --      Pain Score 08/16/23 1302 3     Pain Loc --      Pain Education --      Exclude from Growth Chart --    No data found.  Updated Vital Signs BP 129/84 (BP Location: Right Arm)   Pulse 90   Temp 98.3 F (36.8 C) (Oral)   Resp 18   SpO2 96%   Visual Acuity Right Eye Distance:   Left Eye Distance:   Bilateral Distance:    Right Eye Near:   Left Eye Near:    Bilateral Near:     Physical Exam GEN:     alert, non-ill appearing male in no distress    HENT:  mucus membranes moist, oropharyngeal without lesions or erythema, no tonsillar hypertrophy or exudates, no nasal discharge EYES:   pupils equal and reactive, no scleral injection or discharge NECK:  normal ROM, nno meningismus   RESP:  no increased work of breathing, clear to auscultation bilaterally CVS:   regular rate and  rhythm Skin:   warm and dry    UC Treatments / Results  Labs (all labs ordered are listed, but only abnormal results are displayed) Labs Reviewed  GROUP A STREP BY PCR  SARS CORONAVIRUS 2 BY RT PCR    EKG   Radiology No results found.  Procedures Procedures (including critical care time)  Medications Ordered in UC Medications - No data to display  Initial Impression / Assessment and Plan / UC Course  I have reviewed the triage vital signs and the nursing notes.  Pertinent labs & imaging results that were available during my care of the patient were reviewed by me and considered in my medical decision making (see chart for details).       Pt is a 27 y.o. male who presents for 1 day of respiratory symptoms. Jeter is afebrile here without recent antipyretics. Satting well on room air. Overall pt is non-ill appearing, well hydrated, without respiratory distress. Pulmonary exam is unremarkable.  COVID and strep testing obtained and was negative. History consistent with viral respiratory illness. Discussed symptomatic treatment.  Explained lack of efficacy of antibiotics in viral disease.  Typical duration of symptoms discussed.   Return and ED precautions given and voiced understanding. Discussed MDM, treatment plan and plan for follow-up with patient who agrees with plan.     Final Clinical Impressions(s) / UC Diagnoses   Final diagnoses:  Viral URI with cough     Discharge Instructions      Your COVID and strep tests were negative. You have a common cold which will gradually improve over the next 1-2 weeks. Cough will last up to 3 weeks.    You can take Tylenol and/or Ibuprofen as needed for fever reduction and pain relief.    For cough: honey 1/2 to 1 teaspoon (you can dilute the honey in water or another fluid).  You can also use guaifenesin and dextromethorphan for cough. You can use a humidifier for chest congestion and cough.  If you don't have a humidifier,  you can sit in the bathroom with the hot shower running.      For sore throat: try warm salt water gargles, Mucinex sore throat cough drops or cepacol lozenges, throat spray, warm tea or water with lemon/honey, popsicles or ice, or OTC cold  relief medicine for throat discomfort. You can also purchase chloraseptic spray at the pharmacy or dollar store.   For congestion: take a daily anti-histamine like Zyrtec, Claritin, and a oral decongestant, such as pseudoephedrine.  You can also use Flonase 1-2 sprays in each nostril daily. Afrin is also a good option, if you do not have high blood pressure.    It is important to stay hydrated: drink plenty of fluids (water, gatorade/powerade/pedialyte, juices, or teas) to keep your throat moisturized and help further relieve irritation/discomfort.    Return or go to the Emergency Department if symptoms worsen or do not improve in the next few days       ED Prescriptions   None    PDMP not reviewed this encounter.   Katha Cabal, DO 08/16/23 1425

## 2023-08-16 NOTE — Discharge Instructions (Addendum)
Your COVID and strep tests were negative. You have a common cold which will gradually improve over the next 1-2 weeks. Cough will last up to 3 weeks.    You can take Tylenol and/or Ibuprofen as needed for fever reduction and pain relief.    For cough: honey 1/2 to 1 teaspoon (you can dilute the honey in water or another fluid).  You can also use guaifenesin and dextromethorphan for cough. You can use a humidifier for chest congestion and cough.  If you don't have a humidifier, you can sit in the bathroom with the hot shower running.      For sore throat: try warm salt water gargles, Mucinex sore throat cough drops or cepacol lozenges, throat spray, warm tea or water with lemon/honey, popsicles or ice, or OTC cold relief medicine for throat discomfort. You can also purchase chloraseptic spray at the pharmacy or dollar store.   For congestion: take a daily anti-histamine like Zyrtec, Claritin, and a oral decongestant, such as pseudoephedrine.  You can also use Flonase 1-2 sprays in each nostril daily. Afrin is also a good option, if you do not have high blood pressure.    It is important to stay hydrated: drink plenty of fluids (water, gatorade/powerade/pedialyte, juices, or teas) to keep your throat moisturized and help further relieve irritation/discomfort.    Return or go to the Emergency Department if symptoms worsen or do not improve in the next few days

## 2023-09-28 ENCOUNTER — Ambulatory Visit: Payer: BC Managed Care – PPO | Admitting: Family Medicine

## 2023-09-28 ENCOUNTER — Encounter: Payer: Self-pay | Admitting: Family Medicine

## 2023-09-28 VITALS — BP 120/78 | HR 95 | Temp 98.4°F | Ht 68.0 in | Wt 222.4 lb

## 2023-09-28 DIAGNOSIS — G4700 Insomnia, unspecified: Secondary | ICD-10-CM | POA: Diagnosis not present

## 2023-09-28 DIAGNOSIS — R7303 Prediabetes: Secondary | ICD-10-CM | POA: Diagnosis not present

## 2023-09-28 DIAGNOSIS — Z0001 Encounter for general adult medical examination with abnormal findings: Secondary | ICD-10-CM | POA: Diagnosis not present

## 2023-09-29 ENCOUNTER — Other Ambulatory Visit: Payer: Self-pay | Admitting: Family Medicine

## 2023-09-29 DIAGNOSIS — J3089 Other allergic rhinitis: Secondary | ICD-10-CM

## 2023-09-29 LAB — COMPREHENSIVE METABOLIC PANEL
AG Ratio: 1.4 (calc) (ref 1.0–2.5)
ALT: 68 U/L — ABNORMAL HIGH (ref 9–46)
AST: 29 U/L (ref 10–40)
Albumin: 4.2 g/dL (ref 3.6–5.1)
Alkaline phosphatase (APISO): 87 U/L (ref 36–130)
BUN: 15 mg/dL (ref 7–25)
CO2: 27 mmol/L (ref 20–32)
Calcium: 9.5 mg/dL (ref 8.6–10.3)
Chloride: 103 mmol/L (ref 98–110)
Creat: 1.04 mg/dL (ref 0.60–1.24)
Globulin: 2.9 g/dL (ref 1.9–3.7)
Glucose, Bld: 82 mg/dL (ref 65–99)
Potassium: 4 mmol/L (ref 3.5–5.3)
Sodium: 139 mmol/L (ref 135–146)
Total Bilirubin: 0.2 mg/dL (ref 0.2–1.2)
Total Protein: 7.1 g/dL (ref 6.1–8.1)

## 2023-09-29 LAB — HEMOGLOBIN A1C
Hgb A1c MFr Bld: 5.7 %{Hb} — ABNORMAL HIGH (ref <5.7)
Mean Plasma Glucose: 117 mg/dL
eAG (mmol/L): 6.5 mmol/L

## 2023-10-03 NOTE — Progress Notes (Signed)
Marikay Alar, MD Phone: [phone]  Ryan Norton is a 27 y.o. male who presents today for CPE.  Diet: Does not typically have breakfast, has chips or goldfish or granola for a snack.  Has leftovers for lunch.  Dinner will consist of pasta 2 times a week, other days are meat and veggies.  Not many sweets though does have soda 4 days a week and occasionally has sweet tea. Exercise: None though is active at work Family history-  Prostate cancer: no  Colon cancer: no Vaccines-   Flu: UTD  Tetanus: UTD  COVID19: declines HIV screening: declines Hep C Screening: UTD Tobacco use: no Alcohol use: no Illicit Drug use: no Dentist: due Ophthalmology: no  Sleeping difficulty: Patient notes this week he has had some trouble getting to sleep.  He does look at his phone.  He has caffeine at 6 PM.  No anxiety or depression.  He tried melatonin Gummies with little benefit.  He tries to get in bed around 9 PM though does not go to bed until 10 PM because of his wife's sleep schedule.  Oftentimes wakes up at 5 AM though other times at 6 AM and sometimes its 4 AM based on his work schedule.   Active Ambulatory Problems    Diagnosis Date Noted   Asthma 08/06/2015   Encounter for general adult medical examination with abnormal findings 07/11/2016   Allergic rhinitis 01/25/2017   Vertigo 05/31/2017   Anxiety and depression 06/02/2019   Headache 06/02/2019   Obesity (BMI 30.0-34.9) 11/10/2020   Attention deficit 11/10/2020   Insomnia 06/30/2022   Low back pain 06/30/2022   Resolved Ambulatory Problems    Diagnosis Date Noted   Preventative health care 08/06/2015   URI (upper respiratory infection) 09/27/2016   Canker sore 09/27/2016   Past Medical History:  Diagnosis Date   Anxiety    Chicken pox     Family History  Problem Relation Age of Onset   Hyperlipidemia Father    Hypertension Father    Diabetes Paternal Grandmother    Healthy Mother     Social History    Socioeconomic History   Marital status: Single    Spouse name: Not on file   Number of children: Not on file   Years of education: Not on file   Highest education level: Not on file  Occupational History   Not on file  Tobacco Use   Smoking status: Never   Smokeless tobacco: Never  Vaping Use   Vaping status: Never Used  Substance and Sexual Activity   Alcohol use: No    Alcohol/week: 0.0 standard drinks of alcohol   Drug use: No   Sexual activity: Never  Other Topics Concern   Not on file  Social History Narrative   Not on file   Social Determinants of Health   Financial Resource Strain: Not on file  Food Insecurity: Not on file  Transportation Needs: Not on file  Physical Activity: Not on file  Stress: Not on file  Social Connections: Not on file  Intimate Partner Violence: Not on file    ROS  General:  Negative for nexplained weight loss, fever Skin: Negative for new or changing mole, sore that won't heal HEENT: Negative for trouble hearing, trouble seeing, ringing in ears, mouth sores, hoarseness, change in voice, dysphagia. CV:  Negative for chest pain, dyspnea, edema, palpitations Resp: Negative for cough, dyspnea, hemoptysis GI: Negative for nausea, vomiting, diarrhea, constipation, abdominal pain, melena, hematochezia. GU: Negative  for dysuria, incontinence, urinary hesitance, hematuria, vaginal or penile discharge, polyuria, sexual difficulty, lumps in testicle or breasts MSK: Negative for muscle cramps or aches, joint pain or swelling Neuro: Negative for headaches, weakness, numbness, dizziness, passing out/fainting Psych: Negative for depression, anxiety, memory problems  Objective  Physical Exam Vitals:   09/28/23 1537  BP: 120/78  Pulse: 95  Temp: 98.4 F (36.9 C)  SpO2: 96%    BP Readings from Last 3 Encounters:  09/28/23 120/78  08/16/23 129/84  07/17/23 (!) 144/88   Wt Readings from Last 3 Encounters:  09/28/23 222 lb 6.4 oz (100.9  kg)  09/27/22 210 lb (95.3 kg)  08/11/22 211 lb 6.4 oz (95.9 kg)    Physical Exam Constitutional:      General: He is not in acute distress.    Appearance: He is not diaphoretic.  HENT:     Head: Normocephalic and atraumatic.  Cardiovascular:     Rate and Rhythm: Normal rate and regular rhythm.     Heart sounds: Normal heart sounds.  Pulmonary:     Effort: Pulmonary effort is normal.     Breath sounds: Normal breath sounds.  Abdominal:     General: Bowel sounds are normal. There is no distension.     Palpations: Abdomen is soft.     Tenderness: There is no abdominal tenderness.  Musculoskeletal:     Right lower leg: No edema.     Left lower leg: No edema.  Lymphadenopathy:     Cervical: No cervical adenopathy.  Skin:    General: Skin is warm and dry.  Neurological:     Mental Status: He is alert.  Psychiatric:        Mood and Affect: Mood normal.      Assessment/Plan:   Encounter for general adult medical examination with abnormal findings Assessment & Plan: Physical exam completed.  Encouraged healthy diet and exercise.  Discussed adding in exercise on top of his active work environment.  Patient Clines COVID vaccination.  I encouraged him to see the dentist.  Lab work as outlined.   Prediabetes -     Comprehensive metabolic panel -     Hemoglobin A1c  Insomnia, unspecified type Assessment & Plan: Patient does report some sleeping issues recently.  Suspect this is related to sleep hygiene issues.  Advised no screen time the hour before getting in bed.  No caffeine after 1 PM.  Discussed trying to keep a consistent bedtime and wake time.     Return for physical with new provider.   Marikay Alar, MD [facility]

## 2023-10-03 NOTE — Assessment & Plan Note (Signed)
Physical exam completed.  Encouraged healthy diet and exercise.  Discussed adding in exercise on top of his active work environment.  Patient Clines COVID vaccination.  I encouraged him to see the dentist.  Lab work as outlined.

## 2023-10-03 NOTE — Assessment & Plan Note (Signed)
Patient does report some sleeping issues recently.  Suspect this is related to sleep hygiene issues.  Advised no screen time the hour before getting in bed.  No caffeine after 1 PM.  Discussed trying to keep a consistent bedtime and wake time.

## 2023-10-09 ENCOUNTER — Telehealth: Payer: Self-pay

## 2023-10-09 NOTE — Telephone Encounter (Signed)
-----   Message from Marikay Alar sent at 10/08/2023  9:34 AM EST ----- Please relay the MyChart result message to the patient.  Thanks.

## 2023-10-09 NOTE — Telephone Encounter (Signed)
Lvm for pt to give office a call back in regards to lab results

## 2023-11-15 ENCOUNTER — Other Ambulatory Visit: Payer: Self-pay | Admitting: Family Medicine

## 2024-01-08 ENCOUNTER — Ambulatory Visit
Admission: EM | Admit: 2024-01-08 | Discharge: 2024-01-08 | Disposition: A | Discharge status: Home / Self Care | Attending: Emergency Medicine | Admitting: Emergency Medicine

## 2024-01-08 ENCOUNTER — Encounter: Payer: Self-pay | Admitting: Emergency Medicine

## 2024-01-08 DIAGNOSIS — J069 Acute upper respiratory infection, unspecified: Secondary | ICD-10-CM | POA: Diagnosis not present

## 2024-01-08 LAB — RESP PANEL BY RT-PCR (FLU A&B, COVID) ARPGX2
Influenza A by PCR: NEGATIVE
Influenza B by PCR: NEGATIVE
SARS Coronavirus 2 by RT PCR: NEGATIVE

## 2024-01-08 MED ORDER — IPRATROPIUM BROMIDE 0.06 % NA SOLN: 2.0000 | Freq: Four times a day (QID) | NASAL | 12 refills | Status: DC

## 2024-01-08 MED ORDER — PROMETHAZINE-DM 6.25-15 MG/5ML PO SYRP: 5.0000 mL | ORAL_SOLUTION | Freq: Four times a day (QID) | ORAL | 0 refills | Status: DC | PRN

## 2024-01-08 MED ORDER — BENZONATATE 100 MG PO CAPS: 200.0000 mg | ORAL_CAPSULE | Freq: Three times a day (TID) | ORAL | 0 refills | Status: DC

## 2024-01-08 NOTE — ED Triage Notes (Signed)
 Pt presents with a cough, chest congestion, nasal congestion and hoarse x 2 days.

## 2024-01-08 NOTE — Discharge Instructions (Signed)
 Your testing today was negative for COVID or influenza.  Your exam is consistent with a viral upper respiratory infection.  Use over-the-counter Tylenol and/or ibuprofen according to pack instructions as needed for any fever or pain.  Use the Atrovent nasal spray, 2 squirts in each nostril every 6 hours, as needed for runny nose and postnasal drip.  Use the Tessalon Perles every 8 hours during the day.  Take them with a small sip of water.  They may give you some numbness to the base of your tongue or a metallic taste in your mouth, this is normal.  Use the Promethazine DM cough syrup at bedtime for cough and congestion.  It will make you drowsy so do not take it during the day.  Return for reevaluation or see your primary care provider for any new or worsening symptoms.

## 2024-01-08 NOTE — ED Provider Notes (Signed)
 [facility]    CSN: [id number] Arrival date & time: 01/08/24  1245      History   Chief Complaint Chief Complaint  Patient presents with   Nasal Congestion   Cough   Hoarse    HPI Ryan MARXEN is a 28 y.o. male.   HPI  28 year old male with past medical history dependent for asthma and anxiety presents for evaluation of respiratory symptoms that started 2 days ago that include nasal congestion, ear fullness, hoarseness, cough that is intermittently productive, with intermittent shortness breath and wheezing.  No fever or sore throat.  Past Medical History:  Diagnosis Date   Anxiety    Asthma    Chicken pox     Patient Active Problem List   Diagnosis Date Noted   Insomnia 06/30/2022   Low back pain 06/30/2022   Obesity (BMI 30.0-34.9) 11/10/2020   Attention deficit 11/10/2020   Anxiety and depression 06/02/2019   Headache 06/02/2019   Vertigo 05/31/2017   Allergic rhinitis 01/25/2017   Encounter for general adult medical examination with abnormal findings 07/11/2016   Asthma 08/06/2015    Past Surgical History:  Procedure Laterality Date   no surgical history     WISDOM TOOTH EXTRACTION         Home Medications    Prior to Admission medications   Medication Sig Start Date End Date Taking? Authorizing Provider  benzonatate (TESSALON) 100 MG capsule Take 2 capsules (200 mg total) by mouth every 8 (eight) hours. 01/08/24  Yes Becky Augusta, NP  ipratropium (ATROVENT) 0.06 % nasal spray Place 2 sprays into both nostrils 4 (four) times daily. 01/08/24  Yes Becky Augusta, NP  montelukast (SINGULAIR) 10 MG tablet TAKE 1 TABLET BY MOUTH EVERYDAY AT BEDTIME 10/01/23  Yes Glori Luis, MD  sertraline (ZOLOFT) 50 MG tablet TAKE 1 TABLET BY MOUTH ONCE DAILY 11/19/23  Yes Glori Luis, MD  albuterol (PROVENTIL HFA;VENTOLIN HFA) 108 (90 Base) MCG/ACT inhaler Inhale 2 puffs into the lungs every 6 (six) hours as needed for wheezing or shortness of  breath. 08/30/18   Glori Luis, MD  cetirizine (ZYRTEC) 10 MG tablet TAKE 1 TABLET BY MOUTH EVERY DAY 05/19/21   Glori Luis, MD  promethazine-dextromethorphan (PROMETHAZINE-DM) 6.25-15 MG/5ML syrup Take 5 mLs by mouth 4 (four) times daily as needed. 01/08/24   Becky Augusta, NP    Family History Family History  Problem Relation Age of Onset   Hyperlipidemia Father    Hypertension Father    Diabetes Paternal Grandmother    Healthy Mother     Social History Social History   Tobacco Use   Smoking status: Never   Smokeless tobacco: Never  Vaping Use   Vaping status: Never Used  Substance Use Topics   Alcohol use: No    Alcohol/week: 0.0 standard drinks of alcohol   Drug use: No     Allergies   Patient has no known allergies.   Review of Systems Review of Systems  Constitutional:  Negative for fever.  HENT:  Positive for congestion, ear pain and rhinorrhea. Negative for sore throat.   Respiratory:  Positive for cough, shortness of breath and wheezing.      Physical Exam Triage Vital Signs ED Triage Vitals  Encounter Vitals Group     BP 01/08/24 1255 118/80     Systolic BP Percentile --      Diastolic BP Percentile --      Pulse Rate 01/08/24 1255 69  Resp 01/08/24 1255 16     Temp 01/08/24 1255 97.7 F (36.5 C)     Temp Source 01/08/24 1255 Oral     SpO2 01/08/24 1255 97 %     Weight --      Height --      Head Circumference --      Peak Flow --      Pain Score 01/08/24 1254 0     Pain Loc --      Pain Education --      Exclude from Growth Chart --    No data found.  Updated Vital Signs BP 118/80 (BP Location: Left Arm)   Pulse 69   Temp 97.7 F (36.5 C) (Oral)   Resp 16   SpO2 97%   Visual Acuity Right Eye Distance:   Left Eye Distance:   Bilateral Distance:    Right Eye Near:   Left Eye Near:    Bilateral Near:     Physical Exam Vitals and nursing note reviewed.  Constitutional:      Appearance: Normal appearance. He is  not ill-appearing.  HENT:     Head: Normocephalic and atraumatic.     Right Ear: Tympanic membrane, ear canal and external ear normal. There is no impacted cerumen.     Left Ear: Tympanic membrane, ear canal and external ear normal. There is no impacted cerumen.     Nose: Congestion and rhinorrhea present.     Comments: Patient mucosa is erythematous and mildly edematous with scant clear discharge in both nares.    Mouth/Throat:     Mouth: Mucous membranes are moist.     Pharynx: Oropharynx is clear. No oropharyngeal exudate or posterior oropharyngeal erythema.  Cardiovascular:     Rate and Rhythm: Normal rate and regular rhythm.     Pulses: Normal pulses.     Heart sounds: Normal heart sounds. No murmur heard.    No friction rub. No gallop.  Pulmonary:     Effort: Pulmonary effort is normal.     Breath sounds: Normal breath sounds. No wheezing, rhonchi or rales.  Musculoskeletal:     Cervical back: Normal range of motion and neck supple. No tenderness.  Lymphadenopathy:     Cervical: No cervical adenopathy.  Skin:    General: Skin is warm and dry.     Capillary Refill: Capillary refill takes less than 2 seconds.     Findings: No rash.  Neurological:     General: No focal deficit present.     Mental Status: He is alert and oriented to person, place, and time.      UC Treatments / Results  Labs (all labs ordered are listed, but only abnormal results are displayed) Labs Reviewed  RESP PANEL BY RT-PCR (FLU A&B, COVID) [id number]    EKG   Radiology No results found.  Procedures Procedures (including critical care time)  Medications Ordered in UC Medications - No data to display  Initial Impression / Assessment and Plan / UC Course  I have reviewed the triage vital signs and the nursing notes.  Pertinent labs & imaging results that were available during my care of the patient were reviewed by me and considered in my medical decision making (see chart for details).    Patient is a nontoxic-appearing 28 year old male presenting for evaluation 2 days with the respiratory symptoms outlined HPI above.  The patient reports that he has occasional shortness breath and occasional wheezing though in the exam room he is  able to speak in full sentence without dyspnea or tachypnea.  Room air oxygen saturation 97% with a respiratory of 16 at triage.  He is afebrile with an oral temp of 97.7.  His physical exam does reveal inflammation of his upper respiratory tract as evidenced by inflamed nasal mucosa with scant clear discharge in both nares.  Oropharyngeal exam is benign.  No cervical adenopathy present on exam.  Cardiopulmonary exam physical lung sounds in all fields.  Differential diagnose include COVID, influenza, viral respiratory illness.  I will order a COVID and flu PCR.  Respiratory panel is negative for COVID and influenza.  I will discharge patient with a diagnosis of viral URI with a cough with prescription for Atrovent nasal spray help with the congestion.  Tessalon Perles and Promethazine DM cough syrup for cough and congestion.   Final Clinical Impressions(s) / UC Diagnoses   Final diagnoses:  Viral URI with cough     Discharge Instructions      Your testing today was negative for COVID or influenza.  Your exam is consistent with a viral upper respiratory infection.  Use over-the-counter Tylenol and/or ibuprofen according to pack instructions as needed for any fever or pain.  Use the Atrovent nasal spray, 2 squirts in each nostril every 6 hours, as needed for runny nose and postnasal drip.  Use the Tessalon Perles every 8 hours during the day.  Take them with a small sip of water.  They may give you some numbness to the base of your tongue or a metallic taste in your mouth, this is normal.  Use the Promethazine DM cough syrup at bedtime for cough and congestion.  It will make you drowsy so do not take it during the day.  Return for reevaluation or  see your primary care provider for any new or worsening symptoms.      ED Prescriptions     Medication Sig Dispense Auth. Provider   benzonatate (TESSALON) 100 MG capsule Take 2 capsules (200 mg total) by mouth every 8 (eight) hours. 21 capsule Becky Augusta, NP   ipratropium (ATROVENT) 0.06 % nasal spray Place 2 sprays into both nostrils 4 (four) times daily. 15 mL Becky Augusta, NP   promethazine-dextromethorphan (PROMETHAZINE-DM) 6.25-15 MG/5ML syrup Take 5 mLs by mouth 4 (four) times daily as needed. 118 mL Becky Augusta, NP      PDMP not reviewed this encounter.   Becky Augusta, NP 01/08/24 1438

## 2024-06-11 ENCOUNTER — Other Ambulatory Visit: Payer: Self-pay | Admitting: Nurse Practitioner

## 2024-06-11 NOTE — Telephone Encounter (Unsigned)
 Copied from CRM [id number]. Topic: Clinical - Medication Refill >> Jun 11, 2024  4:20 PM Viola F wrote: Medication: sertraline  (ZOLOFT ) 50 MG tablet [[id number]]  Has the patient contacted their pharmacy? Yes (Agent: If no, request that the patient contact the pharmacy for the refill. If patient does not wish to contact the pharmacy document the reason why and proceed with request.) (Agent: If yes, when and what did the pharmacy advise?)  This is the patient's preferred pharmacy:  [phone] [address] [place] - [address] [place] [address] Phone: [phone] Fax: [phone]  Is this the correct pharmacy for this prescription? Yes If no, delete pharmacy and type the correct one.   Has the prescription been filled recently? Yes  Is the patient out of the medication? No, 4 pills left  Has the patient been seen for an appointment in the last year OR does the patient have an upcoming appointment? Yes  Can we respond through MyChart? Yes  Agent: Please be advised that Rx refills may take up to 3 business days. We ask that you follow-up with your pharmacy.

## 2024-06-12 MED ORDER — SERTRALINE HCL 50 MG PO TABS: 50.0000 mg | ORAL_TABLET | Freq: Every day | ORAL | 0 refills | Status: DC

## 2024-07-28 ENCOUNTER — Other Ambulatory Visit: Payer: Self-pay

## 2024-07-28 DIAGNOSIS — J3089 Other allergic rhinitis: Secondary | ICD-10-CM

## 2024-07-28 MED ORDER — MONTELUKAST SODIUM 10 MG PO TABS: 10.0000 mg | ORAL_TABLET | Freq: Every day | ORAL | 1 refills | Status: DC

## 2024-09-30 ENCOUNTER — Encounter: Payer: Self-pay | Admitting: Nurse Practitioner

## 2024-09-30 ENCOUNTER — Ambulatory Visit: Payer: BC Managed Care – PPO | Admitting: Nurse Practitioner

## 2024-09-30 VITALS — BP 138/88 | HR 85 | Temp 98.1°F | Ht 68.0 in | Wt 237.2 lb

## 2024-09-30 DIAGNOSIS — F419 Anxiety disorder, unspecified: Secondary | ICD-10-CM

## 2024-09-30 DIAGNOSIS — R0683 Snoring: Secondary | ICD-10-CM | POA: Diagnosis not present

## 2024-09-30 DIAGNOSIS — F32A Depression, unspecified: Secondary | ICD-10-CM

## 2024-09-30 DIAGNOSIS — R7303 Prediabetes: Secondary | ICD-10-CM | POA: Diagnosis not present

## 2024-09-30 DIAGNOSIS — J310 Chronic rhinitis: Secondary | ICD-10-CM | POA: Diagnosis not present

## 2024-09-30 DIAGNOSIS — Z0001 Encounter for general adult medical examination with abnormal findings: Secondary | ICD-10-CM

## 2024-09-30 DIAGNOSIS — R41840 Attention and concentration deficit: Secondary | ICD-10-CM | POA: Diagnosis not present

## 2024-09-30 DIAGNOSIS — E669 Obesity, unspecified: Secondary | ICD-10-CM

## 2024-09-30 MED ORDER — MONTELUKAST SODIUM 10 MG PO TABS: 10.0000 mg | ORAL_TABLET | Freq: Every day | ORAL | 3 refills | Status: AC

## 2024-09-30 MED ORDER — SERTRALINE HCL 50 MG PO TABS: 50.0000 mg | ORAL_TABLET | Freq: Every day | ORAL | 3 refills | Status: AC

## 2024-09-30 MED ORDER — ALBUTEROL SULFATE HFA 108 (90 BASE) MCG/ACT IN AERS: 2.0000 | INHALATION_SPRAY | Freq: Four times a day (QID) | RESPIRATORY_TRACT | 2 refills | Status: AC | PRN

## 2024-09-30 MED ORDER — LISDEXAMFETAMINE DIMESYLATE 30 MG PO CAPS: 30.0000 mg | ORAL_CAPSULE | Freq: Every day | ORAL | 0 refills | Status: DC

## 2024-09-30 NOTE — Progress Notes (Signed)
 " Leron Glance, NP-C Phone: [phone]  Ryan Norton is a 28 y.o. male who presents today for transfer of care.   Discussed the use of AI scribe software for clinical note transcription with the patient, who gave verbal consent to proceed.  History of Present Illness   Ryan Norton is a 28 year old male who presents for transfer of care with sleep disturbances and weight management concerns.  He experiences significant sleep disturbances, including worsening snoring (as reported by his wife), frequent awakenings, and tossing and turning at night. He is unsure if the snoring is what wakes him. He reports feeling tired throughout the day. He has not undergone a sleep study previously. Chronic nasal congestion persists despite using allergy medications like Zyrtec , Allegra, and Singulair . He often breathes through a single nostril and has difficulty clearing mucus, suspecting a structural issue with his nasal passages.  He describes frequent 'stress burps' that occur with breathing or drinking water, not associated with typical gas or acid reflux. Occasional heartburn occurs about once a month. He denies chest pain, shortness of breath, or true abdominal pain aside from the described discomfort.  He is concerned about weight gain, noting a lack of motivation to maintain a healthy diet and exercise routine. He overeats at every meal, feeling compelled to eat until he is 'full, full,' despite feeling terrible afterward. 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 He struggles with impulsivity related to his ADHD, which was diagnosed in childhood and previously treated with Concerta. He stopped medication in high school but has noticed a resurgence of symptoms, including impulsivity and hyperfocus on activities like gaming, impacting his ability to manage his diet and exercise.  He reports a recent onset of  left-sided abdominal discomfort, described as a sensation of something pressing against his rib, beginning approximately two weeks ago after lifting a box at work. The pain is persistent, though slightly improved, and more pronounced when his back pain flares up. He reports taking Advil with no real difference and has not tried ice or heat therapy.  He has a history of prediabetes with an A1c of 5.7 last year. He experiences occasional mouth ulcers, similar to his father's history of mouth ulcers. No smoking, alcohol, or drug use reported.      Social History   Tobacco Use  Smoking Status Never  Smokeless Tobacco Never    No current outpatient medications on file prior to visit.   No current facility-administered medications on file prior to visit.     ROS see history of present illness  Objective  Physical Exam Vitals:   09/30/24 1443 09/30/24 1529  BP: (!) 156/98 138/88  Pulse: 85   Temp: 98.1 F (36.7 C)   SpO2: 95%     BP Readings from Last 3 Encounters:  09/30/24 138/88  01/08/24 118/80  09/28/23 120/78   Wt Readings from Last 3 Encounters:  09/30/24 237 lb 3.2 oz (107.6 kg)  09/28/23 222 lb 6.4 oz (100.9 kg)  09/27/22 210 lb (95.3 kg)    Physical Exam Constitutional:      General: He is not in acute distress.    Appearance: Normal appearance.  HENT:     Head: Normocephalic.     Right Ear: Tympanic membrane normal.     Left Ear: Tympanic membrane normal.     Nose: Nose normal.     Mouth/Throat:  Mouth: Mucous membranes are moist.     Pharynx: Oropharynx is clear.  Eyes:     Conjunctiva/sclera: Conjunctivae normal.     Pupils: Pupils are equal, round, and reactive to light.  Neck:     Thyroid: No thyromegaly.  Cardiovascular:     Rate and Rhythm: Normal rate and regular rhythm.     Heart sounds: Normal heart sounds.  Pulmonary:     Effort: Pulmonary effort is normal.     Breath sounds: Normal breath sounds.  Abdominal:     General: Abdomen is  flat. Bowel sounds are normal.     Palpations: Abdomen is soft. There is no mass.     Tenderness: There is no abdominal tenderness.  Musculoskeletal:        General: Normal range of motion.  Lymphadenopathy:     Cervical: No cervical adenopathy.  Skin:    General: Skin is warm and dry.     Findings: No rash.  Neurological:     General: No focal deficit present.     Mental Status: He is alert.  Psychiatric:        Mood and Affect: Mood normal.        Behavior: Behavior normal.      Assessment/Plan: Please see individual problem list.  Encounter for routine adult health examination with abnormal findings Assessment & Plan: Physical exam complete. We will check lab work as outlined. He declines flu and COVID vaccines. Tetanus vaccine is up to date. Encourage routine dental and eye exams. Encourage healthy diet and regular exercise. Return to care in 4 weeks.   Orders: -     Comprehensive metabolic panel with GFR  Snoring Assessment & Plan: He experiences poor sleep quality with snoring and daytime fatigue. No prior sleep study has been conducted. A referral to Dr. Leonore in pulmonology/sleep medicine is made for evaluation and a home sleep study.  Orders: -     Pulmonary Visit  Attention deficit Assessment & Plan: He struggles with obesity, binge eating, and ADHD, finding it difficult to adhere to diet and exercise. Impulsive behavior and binge eating are noted. Trial Vyvanse  30 mg daily for ADHD and binge eating. A low-carb, high-protein diet and regular exercise are advised. Potential side effects of Vyvanse , such as insomnia and increased heart rate, are discussed. PDMP reviewed. He will follow up in one month to assess effectiveness. We will continue to monitor.   Orders: -     Lisdexamfetamine  Dimesylate; Take 1 capsule (30 mg total) by mouth daily.  Dispense: 30 capsule; Refill: 0  Chronic rhinitis Assessment & Plan: Chronic nasal congestion persists despite Zyrtec ,  Flonase  and Singulair . Possible structural nasal issues are considered. A referral to ENT for further evaluation is recommended.  Orders: -     Albuterol  Sulfate HFA; Inhale 2 puffs into the lungs every 6 (six) hours as needed for wheezing or shortness of breath.  Dispense: 8.5 g; Refill: 2 -     Montelukast  Sodium; Take 1 tablet (10 mg total) by mouth at bedtime.  Dispense: 90 tablet; Refill: 3 -     Ambulatory referral to ENT  Anxiety and depression Assessment & Plan: His anxiety and depression are well-controlled on Zoloft , which is primarily used for headache management. Continue Zoloft  50 mg daily for anxiety and headache management. Encouraged to contact if worsening symptoms, unusual behavior changes or suicidal thoughts occur.   Orders: -     Sertraline  HCl; Take 1 tablet (50 mg total) by mouth  daily.  Dispense: 90 tablet; Refill: 3 -     TSH  Prediabetes Assessment & Plan: Check A1c.   Orders: -     Hemoglobin A1c  Obesity (BMI 30-39.9) Assessment & Plan: Encourage healthy diet and regular exercise. Struggles with binge eating. Starting Vyvanse  for ADHD and binge eating.   Orders: -     Pulmonary Visit     Return in about 4 weeks (around 10/28/2024) for Follow up.   Leron Glance, NP-C [facility] "

## 2024-10-01 LAB — COMPREHENSIVE METABOLIC PANEL WITH GFR
ALT: 88 U/L — ABNORMAL HIGH (ref 0–53)
AST: 35 U/L (ref 0–37)
Albumin: 4.5 g/dL (ref 3.5–5.2)
Alkaline Phosphatase: 84 U/L (ref 39–117)
BUN: 13 mg/dL (ref 6–23)
CO2: 29 meq/L (ref 19–32)
Calcium: 9.5 mg/dL (ref 8.4–10.5)
Chloride: 104 meq/L (ref 96–112)
Creatinine, Ser: 0.98 mg/dL (ref 0.40–1.50)
GFR: 104.67 mL/min (ref >60.00)
Glucose, Bld: 81 mg/dL (ref 70–99)
Potassium: 4 meq/L (ref 3.5–5.1)
Sodium: 141 meq/L (ref 135–145)
Total Bilirubin: 0.3 mg/dL (ref 0.2–1.2)
Total Protein: 7.2 g/dL (ref 6.0–8.3)

## 2024-10-01 LAB — TSH: TSH: 1.47 u[IU]/mL (ref 0.35–5.50)

## 2024-10-01 LAB — HEMOGLOBIN A1C: Hgb A1c MFr Bld: 5.6 % (ref 4.6–6.5)

## 2024-10-02 ENCOUNTER — Other Ambulatory Visit (HOSPITAL_COMMUNITY): Payer: Self-pay

## 2024-10-02 ENCOUNTER — Telehealth: Payer: Self-pay

## 2024-10-02 NOTE — Telephone Encounter (Signed)
 Pharmacy Patient Advocate Encounter   Received notification from Onbase that prior authorization for lisdexamfetamine (VYVANSE ) 30 MG capsule  is required/requested.   Insurance verification completed.   The patient is insured through [facility].   Per test claim: PA required; PA submitted to above mentioned insurance via Latent Key/confirmation #/EOC [id number] Status is pending

## 2024-10-03 NOTE — Telephone Encounter (Signed)
 Prior Authorization form/request asks a question that requires your assistance. Please see the question below and advise accordingly. The PA will not be submitted until the necessary information is received.  PLEASE BE ADVISED CAN YOU ASSIST WITH THIS QUESTION. THANKS.

## 2024-10-06 ENCOUNTER — Other Ambulatory Visit (HOSPITAL_COMMUNITY): Payer: Self-pay

## 2024-10-06 NOTE — Telephone Encounter (Signed)
 Pharmacy Patient Advocate Encounter  Received notification from [facility] that Prior Authorization for Lisdexamfetamine  Dimesylate 30MG  capsules  has been APPROVED from 10/06/2024 to 10/06/2027   PA #/Case ID/Reference #: [id number]

## 2024-10-14 ENCOUNTER — Ambulatory Visit: Payer: Self-pay | Admitting: Nurse Practitioner

## 2024-10-14 ENCOUNTER — Ambulatory Visit: Admitting: Sleep Medicine

## 2024-10-14 ENCOUNTER — Other Ambulatory Visit: Payer: Self-pay | Admitting: Nurse Practitioner

## 2024-10-14 DIAGNOSIS — R748 Abnormal levels of other serum enzymes: Secondary | ICD-10-CM

## 2024-10-14 NOTE — Telephone Encounter (Signed)
 Noted, NFN. Pt has been rescheduled.

## 2024-10-20 ENCOUNTER — Encounter: Payer: Self-pay | Admitting: Sleep Medicine

## 2024-10-20 ENCOUNTER — Encounter: Payer: Self-pay | Admitting: Nurse Practitioner

## 2024-10-20 ENCOUNTER — Ambulatory Visit: Admitting: Sleep Medicine

## 2024-10-20 VITALS — BP 126/80 | HR 69 | Temp 98.2°F | Ht 68.0 in | Wt 234.2 lb

## 2024-10-20 DIAGNOSIS — F5104 Psychophysiologic insomnia: Secondary | ICD-10-CM

## 2024-10-20 DIAGNOSIS — G4733 Obstructive sleep apnea (adult) (pediatric): Secondary | ICD-10-CM

## 2024-10-20 DIAGNOSIS — E669 Obesity, unspecified: Secondary | ICD-10-CM

## 2024-10-20 DIAGNOSIS — G4700 Insomnia, unspecified: Secondary | ICD-10-CM | POA: Diagnosis not present

## 2024-10-20 DIAGNOSIS — Z6835 Body mass index (BMI) 35.0-35.9, adult: Secondary | ICD-10-CM | POA: Diagnosis not present

## 2024-10-20 NOTE — Patient Instructions (Signed)
 Ryan Norton

## 2024-10-20 NOTE — Assessment & Plan Note (Signed)
 His anxiety and depression are well-controlled on Zoloft , which is primarily used for headache management. Continue Zoloft  50 mg daily for anxiety and headache management. Encouraged to contact if worsening symptoms, unusual behavior changes or suicidal thoughts occur.

## 2024-10-20 NOTE — Assessment & Plan Note (Signed)
 Chronic nasal congestion persists despite Zyrtec , Flonase  and Singulair . Possible structural nasal issues are considered. A referral to ENT for further evaluation is recommended.

## 2024-10-20 NOTE — Assessment & Plan Note (Signed)
 He struggles with obesity, binge eating, and ADHD, finding it difficult to adhere to diet and exercise. Impulsive behavior and binge eating are noted. Trial Vyvanse  30 mg daily for ADHD and binge eating. A low-carb, high-protein diet and regular exercise are advised. Potential side effects of Vyvanse , such as insomnia and increased heart rate, are discussed. PDMP reviewed. He will follow up in one month to assess effectiveness. We will continue to monitor.

## 2024-10-20 NOTE — Assessment & Plan Note (Signed)
 Check A1c.

## 2024-10-20 NOTE — Progress Notes (Signed)
 "      Name:Ryan Norton MRN: [id number] DOB: September 01, 1996   CHIEF COMPLAINT:  EXCESSIVE DAYTIME SLEEPINESS   HISTORY OF PRESENT ILLNESS: Mr. Ryan Norton is a 28 y.o. w/ a h/o anxiety, depression, ADHD and obesity who present for c/o loud snoring, witnessed apnea and mild daytime sleepiness which has been present for several years. Reports nocturnal awakenings due to snoring, however does not have difficulty falling back to sleep. Denies any significant weight changes. Denies morning headaches, RLS symptoms, dream enactment, cataplexy, hypnagogic or hypnapompic hallucinations. Denies a family history of sleep apnea. Reports occasional drowsy driving. Drinks 1 glass of tea with dinner, denies alcohol, tobacco or illicit drug use.   Bedtime 9-11 pm Sleep onset 10 mins Rise time 5-6 am   EPWORTH SLEEP SCORE 4    10/20/2024    3:00 PM  Results of the Epworth flowsheet  Sitting and reading 1  Watching TV 0  Sitting, inactive in a public place (e.g. a theatre or a meeting) 0  As a passenger in a car for an hour without a break 0  Lying down to rest in the afternoon when circumstances permit 3  Sitting and talking to someone 0  Sitting quietly after a lunch without alcohol 0  In a car, while stopped for a few minutes in traffic 0  Total score 4     PAST MEDICAL HISTORY :   has a past medical history of Anxiety, Asthma, and Chicken pox.  has a past surgical history that includes no surgical history and Wisdom tooth extraction. Prior to Admission medications  Medication Sig Start Date End Date Taking? Authorizing Provider  albuterol  (VENTOLIN  HFA) 108 (90 Base) MCG/ACT inhaler Inhale 2 puffs into the lungs every 6 (six) hours as needed for wheezing or shortness of breath. 09/30/24  Yes Gretel App, NP  lisdexamfetamine  (VYVANSE ) 30 MG capsule Take 1 capsule (30 mg total) by mouth daily. 09/30/24  Yes Gretel App, NP  montelukast  (SINGULAIR ) 10 MG tablet Take 1 tablet (10 mg total)  by mouth at bedtime. 09/30/24  Yes Gretel App, NP  sertraline  (ZOLOFT ) 50 MG tablet Take 1 tablet (50 mg total) by mouth daily. 09/30/24  Yes Gretel App, NP   Allergies[1]  FAMILY HISTORY:  family history includes Diabetes in his paternal grandmother; Healthy in his mother; Hyperlipidemia in his father; Hypertension in his father. SOCIAL HISTORY:  reports that he has never smoked. He has never used smokeless tobacco. He reports that he does not drink alcohol and does not use drugs.   Review of Systems:  Gen:  Denies  fever, sweats, chills weight loss  HEENT: Denies blurred vision, double vision, ear pain, eye pain, hearing loss, nose bleeds, sore throat Cardiac:  No dizziness, chest pain or heaviness, chest tightness,edema, No JVD Resp:   No cough, -sputum production, -shortness of breath,-wheezing, -hemoptysis,  Gi: Denies swallowing difficulty, stomach pain, nausea or vomiting, diarrhea, constipation, bowel incontinence Gu:  Denies bladder incontinence, burning urine Ext:   Denies Joint pain, stiffness or swelling Skin: Denies  skin rash, easy bruising or bleeding or hives Endoc:  Denies polyuria, polydipsia , polyphagia or weight change Psych:   Denies depression, insomnia or hallucinations  Other:  All other systems negative  VITAL SIGNS: BP 126/80   Pulse 69   Temp 98.2 F (36.8 C)   Ht 5' 8 (1.727 m)   Wt 234 lb 3.2 oz (106.2 kg)   SpO2 97%   BMI 35.61 kg/m  Physical Examination:   General Appearance: No distress  EYES PERRLA, EOM intact.   NECK Supple, No JVD Pulmonary: normal breath sounds, No wheezing.  CardiovascularNormal S1,S2.  No m/r/g.   Abdomen: Benign, Soft, non-tender. Skin:   warm, no rashes, no ecchymosis  Extremities: normal, no cyanosis, clubbing. Neuro:without focal findings,  speech normal  PSYCHIATRIC: Mood, affect within normal limits.   ASSESSMENT AND PLAN  OSA I suspect that OSA is likely present due to clinical presentation.  Discussed the consequences of untreated sleep apnea. Advised not to drive drowsy for safety of patient and others. Will complete further evaluation with a home sleep study and follow up to review results.    Obesity Counseled patient on diet and lifestyle modification.   Insomnia Counseled patient on stimulus control and improving sleep hygiene practices.    MEDICATION ADJUSTMENTS/LABS AND TESTS ORDERED: Recommend Sleep Study   Patient  satisfied with Plan of action and management. All questions answered  Follow up to review HST results and treatment plan.   I spent a total of 36 minutes reviewing chart data, face-to-face evaluation with the patient, counseling and coordination of care as detailed above.    Hurshel Bouillon, M.D.  Sleep Medicine [facility]           [1] No Known Allergies  "

## 2024-10-20 NOTE — Assessment & Plan Note (Signed)
 He experiences poor sleep quality with snoring and daytime fatigue. No prior sleep study has been conducted. A referral to Dr. Leonore in pulmonology/sleep medicine is made for evaluation and a home sleep study.

## 2024-10-20 NOTE — Assessment & Plan Note (Signed)
 Physical exam complete. We will check lab work as outlined. He declines flu and COVID vaccines. Tetanus vaccine is up to date. Encourage routine dental and eye exams. Encourage healthy diet and regular exercise. Return to care in 4 weeks.

## 2024-10-20 NOTE — Assessment & Plan Note (Signed)
 Encourage healthy diet and regular exercise. Struggles with binge eating. Starting Vyvanse  for ADHD and binge eating.

## 2024-10-27 ENCOUNTER — Ambulatory Visit
Admission: RE | Admit: 2024-10-27 | Discharge: 2024-10-27 | Disposition: A | Discharge status: Home / Self Care | Source: Ambulatory Visit | Attending: Nurse Practitioner | Admitting: Nurse Practitioner

## 2024-10-27 DIAGNOSIS — R748 Abnormal levels of other serum enzymes: Secondary | ICD-10-CM | POA: Diagnosis present

## 2024-11-06 ENCOUNTER — Ambulatory Visit: Payer: Self-pay | Admitting: Nurse Practitioner

## 2024-11-11 ENCOUNTER — Ambulatory Visit (INDEPENDENT_AMBULATORY_CARE_PROVIDER_SITE_OTHER): Admitting: Nurse Practitioner

## 2024-11-11 VITALS — BP 118/76 | HR 76 | Temp 98.4°F | Ht 68.0 in | Wt 229.0 lb

## 2024-11-11 DIAGNOSIS — R41840 Attention and concentration deficit: Secondary | ICD-10-CM

## 2024-11-11 DIAGNOSIS — K760 Fatty (change of) liver, not elsewhere classified: Secondary | ICD-10-CM

## 2024-11-11 DIAGNOSIS — R0683 Snoring: Secondary | ICD-10-CM

## 2024-11-11 MED ORDER — LISDEXAMFETAMINE DIMESYLATE 30 MG PO CAPS: 30.0000 mg | ORAL_CAPSULE | Freq: Every day | ORAL | 0 refills | Status: AC

## 2024-11-11 NOTE — Progress Notes (Signed)
 " Ryan Glance, NP-C Phone: [phone]  Ryan Norton is a 29 y.o. male who presents today for follow up.   Discussed the use of AI scribe software for clinical note transcription with the patient, who gave verbal consent to proceed.  History of Present Illness   Ryan Norton is a 29 year old male with ADHD who presents for a four-week follow-up after starting Vyvanse .  Since starting Vyvanse , he feels the medication is effective, and his wife has also observed improvements. He experienced jitteriness once early in the treatment but has not had further issues. No heart palpitations or new sleep problems beyond those previously experienced. He is satisfied with the current dose and does not feel an adjustment is necessary.  He notes a decrease in appetite since starting Vyvanse , leading to weight loss. He reports reduced food intake, particularly at breakfast, where he now consumes half of what he used to and sometimes skips lunch. His dinner portions remain similar, but he no longer goes for second helpings. He has lost weight, going from 237 pounds to 229 pounds.  He has a history of fatty liver and recently underwent an ultrasound. One of his liver enzymes was elevated at the last visit. He has not experienced any abdominal pain, particularly in the right upper quadrant.      Tobacco Use History[1]  Medications Ordered Prior to Encounter[2]   ROS see history of present illness  Objective  Physical Exam Vitals:   11/11/24 1620  BP: 118/76  Pulse: 76  Temp: 98.4 F (36.9 C)  SpO2: 95%    BP Readings from Last 3 Encounters:  11/11/24 118/76  10/20/24 126/80  09/30/24 138/88   Wt Readings from Last 3 Encounters:  11/11/24 229 lb (103.9 kg)  10/20/24 234 lb 3.2 oz (106.2 kg)  09/30/24 237 lb 3.2 oz (107.6 kg)    Physical Exam Constitutional:      General: He is not in acute distress.    Appearance: Normal appearance.  HENT:     Head: Normocephalic.   Cardiovascular:     Rate and Rhythm: Normal rate and regular rhythm.     Heart sounds: Normal heart sounds.  Pulmonary:     Effort: Pulmonary effort is normal.     Breath sounds: Normal breath sounds.  Skin:    General: Skin is warm and dry.  Neurological:     General: No focal deficit present.     Mental Status: He is alert.  Psychiatric:        Mood and Affect: Mood normal.        Behavior: Behavior normal.      Assessment/Plan: Please see individual problem list.  Attention deficit Assessment & Plan: ADD is well-managed with Vyvanse , showing significant symptom improvement. He experiences no heart palpitations or sleep disturbances. Appetite is mildly decreased, and there has been some weight loss. Continue Vyvanse  at the current dose, prescribed for three months with refill dates. PDMP reviewed. Schedule follow-up every three months, with an option for virtual visits. We will continue to monitor.   Orders: -     Lisdexamfetamine  Dimesylate; Take 1 capsule (30 mg total) by mouth daily.  Dispense: 30 capsule; Refill: 0 -     Lisdexamfetamine  Dimesylate; Take 1 capsule (30 mg total) by mouth daily.  Dispense: 30 capsule; Refill: 0 -     Lisdexamfetamine  Dimesylate; Take 1 capsule (30 mg total) by mouth daily.  Dispense: 30 capsule; Refill: 0  Fatty liver Assessment &  Plan: Chronic fatty liver is present with previously elevated liver enzymes. A recent ultrasound confirms fatty liver. Weight loss may aid management. Encourage diet and exercise for weight loss. Advise follow-up with a GI specialist and continue monitoring liver enzymes.       Return in about 3 months (around 02/09/2025) for Follow up.   Ryan Glance, NP-C [facility] [place]     [1]  Social History Tobacco Use  Smoking Status Never  Smokeless Tobacco Never  [2]  Current Outpatient Medications on File Prior to Visit  Medication Sig Dispense Refill   albuterol  (VENTOLIN  HFA) 108  (90 Base) MCG/ACT inhaler Inhale 2 puffs into the lungs every 6 (six) hours as needed for wheezing or shortness of breath. 8.5 g 2   montelukast  (SINGULAIR ) 10 MG tablet Take 1 tablet (10 mg total) by mouth at bedtime. 90 tablet 3   sertraline  (ZOLOFT ) 50 MG tablet Take 1 tablet (50 mg total) by mouth daily. 90 tablet 3   No current facility-administered medications on file prior to visit.   "

## 2024-11-19 ENCOUNTER — Encounter: Payer: Self-pay | Admitting: Nurse Practitioner

## 2024-11-19 NOTE — Assessment & Plan Note (Signed)
 Chronic fatty liver is present with previously elevated liver enzymes. A recent ultrasound confirms fatty liver. Weight loss may aid management. Encourage diet and exercise for weight loss. Advise follow-up with a GI specialist and continue monitoring liver enzymes.

## 2024-11-19 NOTE — Assessment & Plan Note (Signed)
 ADD is well-managed with Vyvanse , showing significant symptom improvement. He experiences no heart palpitations or sleep disturbances. Appetite is mildly decreased, and there has been some weight loss. Continue Vyvanse  at the current dose, prescribed for three months with refill dates. PDMP reviewed. Schedule follow-up every three months, with an option for virtual visits. We will continue to monitor.

## 2025-02-10 ENCOUNTER — Ambulatory Visit: Admitting: Nurse Practitioner
